# Patient Record
Sex: Female | Born: 1950 | Race: Black or African American | Hispanic: Refuse to answer | Smoking: Never smoker
Health system: Southern US, Community
[De-identification: ages and names within clinical notes are randomized; demographics above are authoritative.]

## PROBLEM LIST (undated history)

## (undated) DIAGNOSIS — M2550 Pain in unspecified joint: Secondary | ICD-10-CM

## (undated) DIAGNOSIS — R011 Cardiac murmur, unspecified: Secondary | ICD-10-CM

## (undated) DIAGNOSIS — I10 Essential (primary) hypertension: Secondary | ICD-10-CM

## (undated) DIAGNOSIS — K219 Gastro-esophageal reflux disease without esophagitis: Secondary | ICD-10-CM

## (undated) DIAGNOSIS — K649 Unspecified hemorrhoids: Secondary | ICD-10-CM

## (undated) DIAGNOSIS — Z9889 Other specified postprocedural states: Secondary | ICD-10-CM

## (undated) DIAGNOSIS — E785 Hyperlipidemia, unspecified: Secondary | ICD-10-CM

## (undated) DIAGNOSIS — T7840XA Allergy, unspecified, initial encounter: Secondary | ICD-10-CM

## (undated) DIAGNOSIS — M1990 Unspecified osteoarthritis, unspecified site: Secondary | ICD-10-CM

## (undated) DIAGNOSIS — R197 Diarrhea, unspecified: Secondary | ICD-10-CM

## (undated) DIAGNOSIS — R112 Nausea with vomiting, unspecified: Secondary | ICD-10-CM

## (undated) DIAGNOSIS — R51 Headache: Secondary | ICD-10-CM

## (undated) DIAGNOSIS — C801 Malignant (primary) neoplasm, unspecified: Secondary | ICD-10-CM

## (undated) HISTORY — DX: Cardiac murmur, unspecified: R01.1

## (undated) HISTORY — DX: Allergy, unspecified, initial encounter: T78.40XA

## (undated) HISTORY — PX: DILATION AND CURETTAGE OF UTERUS: SHX78

## (undated) HISTORY — DX: Hyperlipidemia, unspecified: E78.5

## (undated) HISTORY — PX: ABDOMINAL HYSTERECTOMY: SHX81

## (undated) HISTORY — DX: Essential (primary) hypertension: I10

## (undated) HISTORY — DX: Malignant (primary) neoplasm, unspecified: C80.1

## (undated) HISTORY — PX: APPENDECTOMY: SHX54

## (undated) HISTORY — PX: OTHER SURGICAL HISTORY: SHX169

## (undated) HISTORY — PX: DIAGNOSTIC LAPAROSCOPY: SUR761

## (undated) HISTORY — PX: CHOLECYSTECTOMY: SHX55

## (undated) HISTORY — DX: Unspecified hemorrhoids: K64.9

---

## 2006-02-20 HISTORY — PX: COLONOSCOPY: SHX174

## 2011-07-24 ENCOUNTER — Ambulatory Visit: Payer: Self-pay | Admitting: Family Medicine

## 2011-07-27 ENCOUNTER — Ambulatory Visit (INDEPENDENT_AMBULATORY_CARE_PROVIDER_SITE_OTHER): Payer: BC Managed Care – PPO | Admitting: Family Medicine

## 2011-07-27 ENCOUNTER — Encounter: Payer: Self-pay | Admitting: Family Medicine

## 2011-07-27 VITALS — BP 140/84 | HR 90 | Resp 18 | Ht 64.0 in | Wt 149.1 lb

## 2011-07-27 DIAGNOSIS — E785 Hyperlipidemia, unspecified: Secondary | ICD-10-CM

## 2011-07-27 DIAGNOSIS — I10 Essential (primary) hypertension: Secondary | ICD-10-CM

## 2011-07-27 DIAGNOSIS — Z1239 Encounter for other screening for malignant neoplasm of breast: Secondary | ICD-10-CM

## 2011-07-27 DIAGNOSIS — M129 Arthropathy, unspecified: Secondary | ICD-10-CM

## 2011-07-27 DIAGNOSIS — R12 Heartburn: Secondary | ICD-10-CM

## 2011-07-27 DIAGNOSIS — K648 Other hemorrhoids: Secondary | ICD-10-CM

## 2011-07-27 DIAGNOSIS — M1990 Unspecified osteoarthritis, unspecified site: Secondary | ICD-10-CM

## 2011-07-27 MED ORDER — HYDROCHLOROTHIAZIDE 25 MG PO TABS: 25.0000 mg | ORAL_TABLET | Freq: Every day | ORAL | Status: DC

## 2011-07-27 MED ORDER — HYDROCORTISONE ACETATE 25 MG RE SUPP: 25.0000 mg | Freq: Two times a day (BID) | RECTAL | Status: AC

## 2011-07-27 NOTE — Patient Instructions (Signed)
For your joints you can try Glucosamine over the counter  Fish oil is also good for inflammation and cholesterol  Continue blood pressure pill Pick up Calcium (1200mg )  and Vitamin D ( 800IU) I will get your records with your labs Try the suppository as needed Epsom salt/Sitz baths  F/U 3 months

## 2011-07-28 ENCOUNTER — Encounter: Payer: Self-pay | Admitting: Family Medicine

## 2011-07-28 DIAGNOSIS — M1990 Unspecified osteoarthritis, unspecified site: Secondary | ICD-10-CM | POA: Insufficient documentation

## 2011-07-28 DIAGNOSIS — I10 Essential (primary) hypertension: Secondary | ICD-10-CM | POA: Insufficient documentation

## 2011-07-28 DIAGNOSIS — R12 Heartburn: Secondary | ICD-10-CM | POA: Insufficient documentation

## 2011-07-28 DIAGNOSIS — K648 Other hemorrhoids: Secondary | ICD-10-CM | POA: Insufficient documentation

## 2011-07-28 NOTE — Assessment & Plan Note (Signed)
Continue tylenol as needed, also using Aspercreme.

## 2011-07-28 NOTE — Progress Notes (Signed)
  Subjective:    Patient ID: Yalitza Teed, female    DOB: Oct 31, 1950, 61 y.o.   MRN: [id number]  HPI Patient here to establish care. Previous PCP Dr. Wynona Neat at [facility] and [facility]. Surgeon Dr. [facility] Medications and history reviewed Hypertension has been on medications for the past year and a half. She is tolerating HCTZ. Arthritis has history of left leg pain mostly in the thigh region she's had imaging done per report told this is arthritis she uses Tylenol arthritis as needed. She is borderline hyperlipidemia. Colonoscopy done due for mammogram status post hysterectomy she does have a problem with internal hemorrhoids and she gets bright red blood in the stool. She was unable to afford Proctofoam. Has been using Tucks pads over-the-counter. 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  Review of Systems - per above   GEN- denies fatigue, fever, weight loss,weakness, recent illness HEENT- denies eye drainage, change in vision, nasal discharge, CVS- denies chest pain, palpitations RESP- denies SOB, cough, wheeze ABD- denies N/V, change in stools, abd pain GU- denies dysuria, hematuria, dribbling, incontinence MSK- + joint pain, muscle aches, injury Neuro- denies headache, dizziness, syncope, seizure activity      Objective:   Physical Exam GEN- NAD, alert and oriented x3 HEENT- PERRL, EOMI, non injected sclera, pink conjunctiva, MMM, oropharynx clear Neck- Supple, no thyromegaly CVS- RRR, soft SEM RESP-CTAB ABD-NABS,soft, NT,ND Hip- no pain with IR/ER, hip rock Back- neg SLR, spine NT EXT- No edema Pulses- Radial, DP- 2+ Psych-normal affect and Mood      Assessment & Plan:

## 2011-07-28 NOTE — Assessment & Plan Note (Signed)
Obtain her colonoscopy report, script given for anusol suppository, sitz baths as needed

## 2011-07-28 NOTE — Assessment & Plan Note (Signed)
Zantac as needed

## 2011-07-28 NOTE — Assessment & Plan Note (Signed)
Continue HCTZ, obtain records and recent labs

## 2011-07-28 NOTE — Assessment & Plan Note (Signed)
Given script to be done in [place]

## 2011-08-21 ENCOUNTER — Encounter: Payer: Self-pay | Admitting: Family Medicine

## 2011-09-14 ENCOUNTER — Encounter: Payer: Self-pay | Admitting: Family Medicine

## 2011-11-02 ENCOUNTER — Ambulatory Visit: Payer: BC Managed Care – PPO | Admitting: Family Medicine

## 2011-11-07 ENCOUNTER — Ambulatory Visit (HOSPITAL_COMMUNITY)
Admission: RE | Admit: 2011-11-07 | Discharge: 2011-11-07 | Disposition: A | Discharge status: Home / Self Care | Payer: BC Managed Care – PPO | Source: Ambulatory Visit | Attending: Family Medicine | Admitting: Family Medicine

## 2011-11-07 ENCOUNTER — Encounter: Payer: Self-pay | Admitting: Family Medicine

## 2011-11-07 ENCOUNTER — Ambulatory Visit (INDEPENDENT_AMBULATORY_CARE_PROVIDER_SITE_OTHER): Payer: BC Managed Care – PPO | Admitting: Family Medicine

## 2011-11-07 VITALS — BP 122/88 | HR 89 | Resp 15 | Ht 64.0 in | Wt 147.8 lb

## 2011-11-07 DIAGNOSIS — E785 Hyperlipidemia, unspecified: Secondary | ICD-10-CM

## 2011-11-07 DIAGNOSIS — M899 Disorder of bone, unspecified: Secondary | ICD-10-CM

## 2011-11-07 DIAGNOSIS — M79606 Pain in leg, unspecified: Secondary | ICD-10-CM

## 2011-11-07 DIAGNOSIS — M8580 Other specified disorders of bone density and structure, unspecified site: Secondary | ICD-10-CM

## 2011-11-07 DIAGNOSIS — M549 Dorsalgia, unspecified: Secondary | ICD-10-CM

## 2011-11-07 DIAGNOSIS — M545 Low back pain, unspecified: Secondary | ICD-10-CM | POA: Insufficient documentation

## 2011-11-07 DIAGNOSIS — M79609 Pain in unspecified limb: Secondary | ICD-10-CM

## 2011-11-07 DIAGNOSIS — M25559 Pain in unspecified hip: Secondary | ICD-10-CM | POA: Insufficient documentation

## 2011-11-07 DIAGNOSIS — I10 Essential (primary) hypertension: Secondary | ICD-10-CM

## 2011-11-07 LAB — CBC
HCT: 36.4 % (ref 36.0–46.0)
MCHC: 35.7 g/dL (ref 30.0–36.0)
MCV: 85.8 fL (ref 78.0–100.0)
RDW: 12.9 % (ref 11.5–15.5)

## 2011-11-07 NOTE — Patient Instructions (Signed)
Continue your current medications Get the x-rays done Blood work to be done  F/U 4 months

## 2011-11-07 NOTE — Progress Notes (Signed)
  Subjective:    Patient ID: Lindsay Mccormick, female    DOB: 04/06/1950, 61 y.o.   MRN: [id number]  HPI Pt still having pain in left leg which radiates to thigh and lower leg, no knee pain, admits to low back pain at time, tylenol helps. No change in bowel or bladder, no joint swelling She has osteopenia per Dexa Scan Due for fasting labs Medications reviewed   Review of Systems  GEN- denies fatigue, fever, weight loss,weakness, recent illness HEENT- denies eye drainage, change in vision, nasal discharge, CVS- denies chest pain, palpitations RESP- denies SOB, cough, wheeze ABD- denies N/V, change in stools, abd pain GU- denies dysuria, hematuria, dribbling, incontinence MSK- denies joint pain, muscle aches, injury Neuro- denies headache, dizziness, syncope, seizure activity      Objective:   Physical Exam GEN- NAD, alert and oriented x3 HEENT- PERRL, EOMI, non injected sclera, pink conjunctiva, MMM, oropharynx clear CVS- RRR, soft SEM RESP-CTAB ABD-NABS,soft, NT,ND Hip- no pain with IR/ER,  Back- neg SLR, spine NT EXT- No edema Pulses- Radial, DP- 2+        Assessment & Plan:

## 2011-11-08 ENCOUNTER — Encounter: Payer: Self-pay | Admitting: Family Medicine

## 2011-11-08 DIAGNOSIS — M810 Age-related osteoporosis without current pathological fracture: Secondary | ICD-10-CM | POA: Insufficient documentation

## 2011-11-08 DIAGNOSIS — M79606 Pain in leg, unspecified: Secondary | ICD-10-CM | POA: Insufficient documentation

## 2011-11-08 LAB — COMPREHENSIVE METABOLIC PANEL
ALT: 15 U/L (ref 0–35)
AST: 15 U/L (ref 0–37)
Creat: 0.66 mg/dL (ref 0.50–1.10)
Total Bilirubin: 0.4 mg/dL (ref 0.3–1.2)

## 2011-11-08 LAB — CK: Total CK: 50 U/L (ref 7–177)

## 2011-11-08 LAB — LIPID PANEL
HDL: 55 mg/dL (ref >39)
LDL Cholesterol: 134 mg/dL — ABNORMAL HIGH (ref 0–99)
Total CHOL/HDL Ratio: 4.2 Ratio
Triglycerides: 207 mg/dL — ABNORMAL HIGH (ref <150)
VLDL: 41 mg/dL — ABNORMAL HIGH (ref 0–40)

## 2011-11-08 NOTE — Assessment & Plan Note (Signed)
Start fish oil TG elevated

## 2011-11-08 NOTE — Assessment & Plan Note (Signed)
Well controlled, no change to meds 

## 2011-11-08 NOTE — Assessment & Plan Note (Addendum)
Unclear cause, chronic problem, xrays obtained which were normal, muscle enzymes normal, continue tylenol, no difficulties with gait, does not sound neuropathic

## 2011-11-08 NOTE — Assessment & Plan Note (Signed)
calcium Vit D

## 2012-03-08 ENCOUNTER — Ambulatory Visit: Payer: BC Managed Care – PPO | Admitting: Family Medicine

## 2012-06-03 ENCOUNTER — Ambulatory Visit (INDEPENDENT_AMBULATORY_CARE_PROVIDER_SITE_OTHER): Payer: BC Managed Care – PPO | Admitting: Family Medicine

## 2012-06-03 ENCOUNTER — Encounter: Payer: Self-pay | Admitting: Family Medicine

## 2012-06-03 VITALS — BP 142/80 | HR 76 | Resp 18 | Ht 64.0 in | Wt 147.0 lb

## 2012-06-03 DIAGNOSIS — J302 Other seasonal allergic rhinitis: Secondary | ICD-10-CM

## 2012-06-03 DIAGNOSIS — M899 Disorder of bone, unspecified: Secondary | ICD-10-CM

## 2012-06-03 DIAGNOSIS — M949 Disorder of cartilage, unspecified: Secondary | ICD-10-CM

## 2012-06-03 DIAGNOSIS — E785 Hyperlipidemia, unspecified: Secondary | ICD-10-CM

## 2012-06-03 DIAGNOSIS — I10 Essential (primary) hypertension: Secondary | ICD-10-CM

## 2012-06-03 DIAGNOSIS — J309 Allergic rhinitis, unspecified: Secondary | ICD-10-CM

## 2012-06-03 DIAGNOSIS — M8580 Other specified disorders of bone density and structure, unspecified site: Secondary | ICD-10-CM

## 2012-06-03 MED ORDER — LEVOCETIRIZINE DIHYDROCHLORIDE 5 MG PO TABS: 5.0000 mg | ORAL_TABLET | Freq: Every evening | ORAL | Status: DC

## 2012-06-03 MED ORDER — HYDROCHLOROTHIAZIDE 25 MG PO TABS: 25.0000 mg | ORAL_TABLET | Freq: Every day | ORAL | Status: DC

## 2012-06-03 NOTE — Assessment & Plan Note (Signed)
Trial of xyzal Failed claritin, benadryl, zyrtec, continue flonase

## 2012-06-03 NOTE — Patient Instructions (Addendum)
Try the Xyxal Calcium 1200mg  and Vit D 800IU  Continue current medications Get the labs done fasting F/U 4 months

## 2012-06-03 NOTE — Assessment & Plan Note (Signed)
Repeat FLP

## 2012-06-03 NOTE — Assessment & Plan Note (Signed)
Pt will obtain her last Dexa Scan and let me know Advised calcium and Vit D Weight bearing exercise

## 2012-06-03 NOTE — Assessment & Plan Note (Signed)
Well controlled, no change to meds 

## 2012-06-03 NOTE — Progress Notes (Signed)
  Subjective:    Patient ID: Lindsay Mccormick, female    DOB: 1950/09/16, 62 y.o.   MRN: [id number]  HPI Pt here to f/u chronic medical problems. Allergies causing her to sneeze, runny eyes and nose. Medicine not working as well. Due for repeat labs Taking MVI   Review of Systems - per above   GEN- denies fatigue, fever, weight loss,weakness, recent illness HEENT- denies eye drainage, change in vision, +nasal discharge, CVS- denies chest pain, palpitations RESP- denies SOB, cough, wheeze ABD- denies N/V, change in stools, abd pain GU- denies dysuria, hematuria, dribbling, incontinence MSK- denies joint pain, muscle aches, injury Neuro- denies headache, dizziness, syncope, seizure activity      Objective:   Physical Exam GEN- NAD, alert and oriented x3 HEENT- PERRL, EOMI, non injected sclera, pink conjunctiva, MMM, oropharynx clear Neck- Supple, no thryomegaly CVS- RRR, no murmur RESP-CTAB EXT- No edema Pulses- Radial, DP- 2+        Assessment & Plan:

## 2012-06-18 ENCOUNTER — Encounter: Payer: Self-pay | Admitting: *Deleted

## 2012-09-30 ENCOUNTER — Encounter (HOSPITAL_COMMUNITY)
Admission: RE | Admit: 2012-09-30 | Discharge: 2012-09-30 | Disposition: A | Discharge status: Home / Self Care | Payer: BC Managed Care – PPO | Source: Ambulatory Visit | Attending: Anesthesiology | Admitting: Anesthesiology

## 2012-09-30 ENCOUNTER — Encounter (HOSPITAL_COMMUNITY)
Admission: RE | Admit: 2012-09-30 | Discharge: 2012-09-30 | Disposition: A | Discharge status: Home / Self Care | Payer: BC Managed Care – PPO | Source: Ambulatory Visit | Attending: Oral and Maxillofacial Surgery | Admitting: Oral and Maxillofacial Surgery

## 2012-09-30 ENCOUNTER — Encounter (HOSPITAL_COMMUNITY): Payer: Self-pay

## 2012-09-30 DIAGNOSIS — Z01810 Encounter for preprocedural cardiovascular examination: Secondary | ICD-10-CM | POA: Insufficient documentation

## 2012-09-30 DIAGNOSIS — Z01818 Encounter for other preprocedural examination: Secondary | ICD-10-CM | POA: Insufficient documentation

## 2012-09-30 DIAGNOSIS — Z01812 Encounter for preprocedural laboratory examination: Secondary | ICD-10-CM | POA: Insufficient documentation

## 2012-09-30 HISTORY — DX: Diarrhea, unspecified: R19.7

## 2012-09-30 HISTORY — DX: Unspecified osteoarthritis, unspecified site: M19.90

## 2012-09-30 HISTORY — DX: Nausea with vomiting, unspecified: R11.2

## 2012-09-30 HISTORY — DX: Pain in unspecified joint: M25.50

## 2012-09-30 HISTORY — DX: Other specified postprocedural states: Z98.890

## 2012-09-30 HISTORY — DX: Gastro-esophageal reflux disease without esophagitis: K21.9

## 2012-09-30 HISTORY — DX: Headache: R51

## 2012-09-30 LAB — CBC
HCT: 36.3 % (ref 36.0–46.0)
MCH: 30.4 pg (ref 26.0–34.0)
MCV: 88.3 fL (ref 78.0–100.0)
Platelets: 272 10*3/uL (ref 150–400)
RDW: 12.5 % (ref 11.5–15.5)

## 2012-09-30 LAB — BASIC METABOLIC PANEL
BUN: 5 mg/dL — ABNORMAL LOW (ref 6–23)
CO2: 29 mEq/L (ref 19–32)
Calcium: 9.7 mg/dL (ref 8.4–10.5)
Chloride: 103 mEq/L (ref 96–112)
Creatinine, Ser: 0.65 mg/dL (ref 0.50–1.10)
Glucose, Bld: 104 mg/dL — ABNORMAL HIGH (ref 70–99)

## 2012-09-30 NOTE — Progress Notes (Signed)
Pt doesn't have a cardiologist  Denies ever having an echo/stress test/heart cath  Medical MD is [facility] in [place]  Denies EKG or CXR in past yr

## 2012-09-30 NOTE — Pre-Procedure Instructions (Signed)
Karl Knarr  09/30/2012   Your procedure is scheduled on:  Mon, Aug 18 @ 9:30 AM  Report to [facility] at 7:30 AM.  Call this number if you have problems the morning of surgery: [phone]   Remember:   Do not eat food or drink liquids after midnight.   Take these medicines the morning of surgery with A SIP OF WATER: Zantac(Ranitidine)               Stop taking your Ibuprofen.No Goody's,BC's,Aleve,Aspirin,Fish Oil,or any Herbal Medications   Do not wear jewelry, make-up or nail polish.  Do not wear lotions, powders, or perfumes. You may wear deodorant.  Do not shave 48 hours prior to surgery.   Do not bring valuables to the hospital.  [facility] is not responsible                   for any belongings or valuables.  Contacts, dentures or bridgework may not be worn into surgery.  Leave suitcase in the car. After surgery it may be brought to your room.     Patients discharged the day of surgery will not be allowed to drive  home.    Special Instructions: Shower using CHG 2 nights before surgery and the night before surgery.  If you shower the day of surgery use CHG.  Use special wash - you have one bottle of CHG for all showers.  You should use approximately 1/3 of the bottle for each shower.   Please read over the following fact sheets that you were given: Pain Booklet, Coughing and Deep Breathing and Surgical Site Infection Prevention

## 2012-10-02 ENCOUNTER — Ambulatory Visit: Payer: Self-pay | Admitting: Family Medicine

## 2012-10-02 ENCOUNTER — Encounter (HOSPITAL_COMMUNITY): Payer: Self-pay | Admitting: Oral and Maxillofacial Surgery

## 2012-10-02 DIAGNOSIS — M2740 Unspecified cyst of jaw: Secondary | ICD-10-CM | POA: Diagnosis present

## 2012-10-02 NOTE — H&P (Signed)
Lindsay Mccormick is an 62 y.o. female.   Chief Complaint: "Cyst in Jaw"  HPI: Lindsay Mccormick is a 62 year old female that was referred by her general dentist, Dr. Sophronia Simas, in [place], [place] for evaluation of a possible cyst in her right mandible associated with the impacted tooth #32.  We biopsied this large cyst and the final pathology report was an inflammed dentigerous cyst.  Due to the lesion being rather large (3.5 x 2.5 cm), the decision was made to excise the lesion and remove the impacted tooth in the OR.  PMHx:  Past Medical History  Diagnosis Date  . Hemorrhoids     Internal;uses Proctofoam bid  . Allergy     takes OTC med nightly and Benadryl nightly  . Cardiac murmur     Benign  . Hyperlipidemia     borderline but no meds at present  . GERD (gastroesophageal reflux disease)     takes Zantac daily  . Diarrhea     takes Imodium daily  . PONV (postoperative nausea and vomiting)   . Hypertension     takes HCTZ daily  . Headache(784.0)     rarely  . Arthritis   . Joint pain     PSx:  Past Surgical History  Procedure Laterality Date  . Abdominal hysterectomy    . Cholecystectomy    . Cesarean section      x 2  . Colonoscopy    . Appendectomy    . Diagnostic laparoscopy    . Dilation and curettage of uterus    . Jaw biopsy      Family Hx:  Family History  Problem Relation Age of Onset  . Heart disease Mother   . Cancer Mother     breast  . Diabetes Mother   . Hypertension Father   . Heart disease Father   . Cancer Father     prostate    Social History:  reports that she has never smoked. She does not have any smokeless tobacco history on file. She reports that she does not drink alcohol or use illicit drugs.  Allergies:  Allergies  Allergen Reactions  . Codeine     Sick and trembling    Meds:  No prescriptions prior to admission    Labs: No results found for this or any previous visit (from the past 48 hour(s)).  Radiology: No results found.  ROS:  A comprehensive review of systems was negative.  Vitals: There were no vitals taken for this visit.  Physical Exam: General appearance: alert and cooperative Head: Normocephalic, without obvious abnormality, atraumatic Eyes: conjunctivae/corneas clear. PERRL, EOM's intact. Fundi benign. Ears: normal TM's and external ear canals both ears Nose: Nares normal. Septum midline. Mucosa normal. No drainage or sinus tenderness. Throat: lips, mucosa, and tongue normal; teeth and gums normal Neck: no adenopathy, no carotid bruit, no JVD, supple, symmetrical, trachea midline and thyroid not enlarged, symmetric, no tenderness/mass/nodules Resp: clear to auscultation bilaterally Cardio: Murmur present GI: soft, non-tender; bowel sounds normal; no masses,  no organomegaly Extremities: extremities normal, atraumatic, no cyanosis or edema Pulses: 2+ and symmetric Skin: Skin color, texture, turgor normal. No rashes or lesions Lymph nodes: Cervical, supraclavicular, and axillary nodes normal. Neurologic: Alert and oriented X 3, normal strength and tone. Normal symmetric reflexes. Normal coordination and gait  Radiographic Panorex and CBCT shows 3.5 x 2.5 cm radiolucent lesion associated with complete bony impacted #32.    Assessment/Plan Lindsay Mccormick has a complete bony impacted #32 and  a biopsy proven inflammed dentigerous cyst in the right mandible.  1. The plan is to take the patient to the OR for Enucleation of Cyst and surgical extraction of the complete bony impacted #32 (which is positioned close to the inferior border and inferior alveolar nerve - which will be a difficult approach). 2. I explain to the patient that the mandible could possibly be fractured in removing this cyst and tooth, and we would perform ORIF of mandible with MMF if necessary.     [place],Kemal Amores L  10/02/2012, 3:47 PM

## 2012-10-03 ENCOUNTER — Encounter (HOSPITAL_COMMUNITY): Payer: Self-pay | Admitting: Pharmacy Technician

## 2012-10-03 ENCOUNTER — Ambulatory Visit: Payer: BC Managed Care – PPO | Admitting: Family Medicine

## 2012-10-03 NOTE — Progress Notes (Signed)
Requested orders from [facility];states Dr.Cram is out of the office today but will let him know

## 2012-10-03 NOTE — Progress Notes (Signed)
Requested orders, left message with Eunice Blase at Dr. Rhona Raider office.

## 2012-10-04 ENCOUNTER — Ambulatory Visit (HOSPITAL_BASED_OUTPATIENT_CLINIC_OR_DEPARTMENT_OTHER): Admit: 2012-10-04 | Payer: Self-pay | Admitting: Oral and Maxillofacial Surgery

## 2012-10-04 ENCOUNTER — Encounter (HOSPITAL_BASED_OUTPATIENT_CLINIC_OR_DEPARTMENT_OTHER): Payer: Self-pay

## 2012-10-04 SURGERY — DENTAL RESTORATION/EXTRACTIONS
Anesthesia: General | Site: Mouth

## 2012-10-07 ENCOUNTER — Encounter (HOSPITAL_COMMUNITY)
Admission: RE | Disposition: A | Discharge status: Home / Self Care | Payer: Self-pay | Source: Ambulatory Visit | Attending: Oral and Maxillofacial Surgery

## 2012-10-07 ENCOUNTER — Encounter (HOSPITAL_COMMUNITY): Payer: Self-pay | Admitting: Surgery

## 2012-10-07 ENCOUNTER — Encounter (HOSPITAL_COMMUNITY): Payer: Self-pay | Admitting: Certified Registered Nurse Anesthetist

## 2012-10-07 ENCOUNTER — Ambulatory Visit (HOSPITAL_COMMUNITY)
Admission: RE | Admit: 2012-10-07 | Discharge: 2012-10-07 | Disposition: A | Discharge status: Home / Self Care | Payer: BC Managed Care – PPO | Source: Ambulatory Visit | Attending: Oral and Maxillofacial Surgery | Admitting: Oral and Maxillofacial Surgery

## 2012-10-07 ENCOUNTER — Ambulatory Visit (HOSPITAL_COMMUNITY): Payer: BC Managed Care – PPO | Admitting: Certified Registered Nurse Anesthetist

## 2012-10-07 DIAGNOSIS — E785 Hyperlipidemia, unspecified: Secondary | ICD-10-CM | POA: Insufficient documentation

## 2012-10-07 DIAGNOSIS — K090 Developmental odontogenic cysts: Secondary | ICD-10-CM | POA: Insufficient documentation

## 2012-10-07 DIAGNOSIS — R011 Cardiac murmur, unspecified: Secondary | ICD-10-CM | POA: Insufficient documentation

## 2012-10-07 DIAGNOSIS — Z885 Allergy status to narcotic agent status: Secondary | ICD-10-CM | POA: Insufficient documentation

## 2012-10-07 DIAGNOSIS — K006 Disturbances in tooth eruption: Secondary | ICD-10-CM | POA: Insufficient documentation

## 2012-10-07 DIAGNOSIS — R197 Diarrhea, unspecified: Secondary | ICD-10-CM | POA: Insufficient documentation

## 2012-10-07 DIAGNOSIS — J309 Allergic rhinitis, unspecified: Secondary | ICD-10-CM | POA: Insufficient documentation

## 2012-10-07 DIAGNOSIS — M2740 Unspecified cyst of jaw: Secondary | ICD-10-CM

## 2012-10-07 DIAGNOSIS — K648 Other hemorrhoids: Secondary | ICD-10-CM | POA: Insufficient documentation

## 2012-10-07 DIAGNOSIS — M129 Arthropathy, unspecified: Secondary | ICD-10-CM | POA: Insufficient documentation

## 2012-10-07 DIAGNOSIS — K219 Gastro-esophageal reflux disease without esophagitis: Secondary | ICD-10-CM | POA: Insufficient documentation

## 2012-10-07 DIAGNOSIS — I10 Essential (primary) hypertension: Secondary | ICD-10-CM | POA: Insufficient documentation

## 2012-10-07 HISTORY — PX: TOOTH EXTRACTION: SHX859

## 2012-10-07 SURGERY — DENTAL RESTORATION/EXTRACTIONS
Anesthesia: General | Site: Mouth | Wound class: Clean Contaminated

## 2012-10-07 MED ORDER — LIDOCAINE-EPINEPHRINE 2 %-1:100000 IJ SOLN
INTRAMUSCULAR | Status: AC
  Filled 2012-10-07: qty 10.2

## 2012-10-07 MED ORDER — CLINDAMYCIN PHOSPHATE 600 MG/50ML IV SOLN
600.0000 mg | Freq: Once | INTRAVENOUS | Status: AC
  Administered 2012-10-07: 600 mg via INTRAVENOUS
  Filled 2012-10-07: qty 50

## 2012-10-07 MED ORDER — PROPOFOL 10 MG/ML IV BOLUS
INTRAVENOUS | Status: DC | PRN
  Administered 2012-10-07: 180 mg via INTRAVENOUS

## 2012-10-07 MED ORDER — ARTIFICIAL TEARS OP OINT
TOPICAL_OINTMENT | OPHTHALMIC | Status: DC | PRN
  Administered 2012-10-07: 1 via OPHTHALMIC

## 2012-10-07 MED ORDER — 0.9 % SODIUM CHLORIDE (POUR BTL) OPTIME
TOPICAL | Status: DC | PRN
  Administered 2012-10-07: 1000 mL

## 2012-10-07 MED ORDER — FENTANYL CITRATE 0.05 MG/ML IJ SOLN
INTRAMUSCULAR | Status: DC | PRN
  Administered 2012-10-07 (×2): 100 ug via INTRAVENOUS

## 2012-10-07 MED ORDER — LIDOCAINE HCL (CARDIAC) 20 MG/ML IV SOLN
INTRAVENOUS | Status: DC | PRN
  Administered 2012-10-07: 100 mg via INTRAVENOUS

## 2012-10-07 MED ORDER — OXYMETAZOLINE HCL 0.05 % NA SOLN
NASAL | Status: DC | PRN
  Administered 2012-10-07: 1 via NASAL

## 2012-10-07 MED ORDER — ROCURONIUM BROMIDE 100 MG/10ML IV SOLN
INTRAVENOUS | Status: DC | PRN
  Administered 2012-10-07: 50 mg via INTRAVENOUS

## 2012-10-07 MED ORDER — BUPIVACAINE-EPINEPHRINE (PF) 0.5% -1:200000 IJ SOLN
INTRAMUSCULAR | Status: AC
  Filled 2012-10-07: qty 3.6

## 2012-10-07 MED ORDER — MIDAZOLAM HCL 2 MG/2ML IJ SOLN: 1.0000 mg | INTRAMUSCULAR | Status: DC | PRN

## 2012-10-07 MED ORDER — METHYLENE BLUE 1 % INJ SOLN
INTRAMUSCULAR | Status: AC
  Filled 2012-10-07: qty 10

## 2012-10-07 MED ORDER — ONDANSETRON HCL 4 MG/2ML IJ SOLN
INTRAMUSCULAR | Status: DC | PRN
  Administered 2012-10-07: 4 mg via INTRAVENOUS

## 2012-10-07 MED ORDER — LIDOCAINE-EPINEPHRINE 1 %-1:100000 IJ SOLN
INTRAMUSCULAR | Status: AC
  Filled 2012-10-07: qty 1

## 2012-10-07 MED ORDER — NALOXONE HCL 0.4 MG/ML IJ SOLN
INTRAMUSCULAR | Status: DC | PRN
  Administered 2012-10-07: 60 ug via INTRAVENOUS

## 2012-10-07 MED ORDER — LACTATED RINGERS IV SOLN
INTRAVENOUS | Status: DC | PRN
  Administered 2012-10-07 (×2): via INTRAVENOUS

## 2012-10-07 MED ORDER — DEXAMETHASONE SODIUM PHOSPHATE 4 MG/ML IJ SOLN
INTRAMUSCULAR | Status: DC | PRN
  Administered 2012-10-07: 4 mg via INTRAVENOUS

## 2012-10-07 MED ORDER — MIDAZOLAM HCL 5 MG/5ML IJ SOLN
INTRAMUSCULAR | Status: DC | PRN
  Administered 2012-10-07: 2 mg via INTRAVENOUS

## 2012-10-07 MED ORDER — METOCLOPRAMIDE HCL 5 MG/ML IJ SOLN
INTRAMUSCULAR | Status: DC | PRN
  Administered 2012-10-07: 5 mg via INTRAVENOUS

## 2012-10-07 MED ORDER — HYDROMORPHONE HCL PF 1 MG/ML IJ SOLN: 0.2500 mg | INTRAMUSCULAR | Status: DC | PRN

## 2012-10-07 MED ORDER — FENTANYL CITRATE 0.05 MG/ML IJ SOLN: 50.0000 ug | Freq: Once | INTRAMUSCULAR | Status: DC

## 2012-10-07 MED ORDER — PROMETHAZINE HCL 25 MG/ML IJ SOLN: 6.2500 mg | INTRAMUSCULAR | Status: DC | PRN

## 2012-10-07 MED ORDER — GLYCOPYRROLATE 0.2 MG/ML IJ SOLN
INTRAMUSCULAR | Status: DC | PRN
  Administered 2012-10-07: 0.6 mg via INTRAVENOUS

## 2012-10-07 MED ORDER — OXYMETAZOLINE HCL 0.05 % NA SOLN
NASAL | Status: AC
  Filled 2012-10-07: qty 15

## 2012-10-07 MED ORDER — PHENYLEPHRINE HCL 10 MG/ML IJ SOLN
INTRAMUSCULAR | Status: DC | PRN
  Administered 2012-10-07 (×3): 40 ug via INTRAVENOUS

## 2012-10-07 MED ORDER — LIDOCAINE-EPINEPHRINE 1 %-1:100000 IJ SOLN
INTRAMUSCULAR | Status: DC | PRN
  Administered 2012-10-07 (×4): 1.7 mL

## 2012-10-07 MED ORDER — NEOSTIGMINE METHYLSULFATE 1 MG/ML IJ SOLN
INTRAMUSCULAR | Status: DC | PRN
  Administered 2012-10-07: 4 mg via INTRAVENOUS

## 2012-10-07 MED ORDER — BUPIVACAINE-EPINEPHRINE 0.5% -1:200000 IJ SOLN
INTRAMUSCULAR | Status: DC | PRN
  Administered 2012-10-07 (×2): 1.8 mL

## 2012-10-07 SURGICAL SUPPLY — 61 items
ATTRACTOMAT 16X20 MAGNETIC DRP (DRAPES) ×2 IMPLANT
BAND DENTAL 1/4IN PULL MED (MISCELLANEOUS) IMPLANT
BAND DENTAL 3/16IN PULL HEAVY (MISCELLANEOUS) IMPLANT
BLADE SURG 15 STRL LF DISP TIS (BLADE) ×2 IMPLANT
BLADE SURG 15 STRL SS (BLADE) ×2
BUR CROSS CUT (BURR)
BUR CROSS CUT FISSURE 1.6 (BURR) ×2 IMPLANT
BUR EGG ELITE 4.0 (BURR) ×2 IMPLANT
BUR RND FLUTED 2.5 (BURR) ×2 IMPLANT
BUR SRG MED 1.2XXCUT FSSR (BURR) IMPLANT
BUR SRG MED 1.6XXCUT FSSR (BURR) IMPLANT
BURR SRG MED 1.2XXCUT FSSR (BURR)
BURR SRG MED 1.6XXCUT FSSR (BURR)
CANISTER SUCTION 2500CC (MISCELLANEOUS) ×2 IMPLANT
CLEANER TIP ELECTROSURG 2X2 (MISCELLANEOUS) ×2 IMPLANT
CLOTH BEACON ORANGE TIMEOUT ST (SAFETY) ×2 IMPLANT
CONT SPEC 4OZ CLIKSEAL STRL BL (MISCELLANEOUS) ×2 IMPLANT
COVER SURGICAL LIGHT HANDLE (MISCELLANEOUS) ×2 IMPLANT
DRESSING TELFA 8X3 (GAUZE/BANDAGES/DRESSINGS) IMPLANT
ELECT COATED BLADE 2.86 ST (ELECTRODE) IMPLANT
ELECT NEEDLE BLADE 2-5/6 (NEEDLE) ×2 IMPLANT
ELECT REM PT RETURN 9FT ADLT (ELECTROSURGICAL) ×2
ELECTRODE REM PT RTRN 9FT ADLT (ELECTROSURGICAL) ×1 IMPLANT
GAUZE PACKING FOLDED 2  STR (GAUZE/BANDAGES/DRESSINGS) ×1
GAUZE PACKING FOLDED 2 STR (GAUZE/BANDAGES/DRESSINGS) ×1 IMPLANT
GAUZE SPONGE 4X4 16PLY XRAY LF (GAUZE/BANDAGES/DRESSINGS) ×2 IMPLANT
GLOVE BIOGEL M STRL SZ7.5 (GLOVE) ×2 IMPLANT
GLOVE BIOGEL PI IND STRL 6 (GLOVE) ×1 IMPLANT
GLOVE BIOGEL PI IND STRL 7.5 (GLOVE) ×3 IMPLANT
GLOVE BIOGEL PI INDICATOR 6 (GLOVE) ×1
GLOVE BIOGEL PI INDICATOR 7.5 (GLOVE) ×3
GLOVE ORTHO TXT STRL SZ7.5 (GLOVE) ×4 IMPLANT
GOWN STRL NON-REIN LRG LVL3 (GOWN DISPOSABLE) ×4 IMPLANT
KIT BASIN OR (CUSTOM PROCEDURE TRAY) ×2 IMPLANT
KIT ROOM TURNOVER OR (KITS) ×2 IMPLANT
NEEDLE BLUNT 16X1.5 OR ONLY (NEEDLE) ×4 IMPLANT
NEEDLE DENTAL 27 LONG (NEEDLE) ×4 IMPLANT
NS IRRIG 1000ML POUR BTL (IV SOLUTION) ×2 IMPLANT
PACK EENT II TURBAN DRAPE (CUSTOM PROCEDURE TRAY) ×2 IMPLANT
PAD ARMBOARD 7.5X6 YLW CONV (MISCELLANEOUS) ×4 IMPLANT
PENCIL BUTTON HOLSTER BLD 10FT (ELECTRODE) ×2 IMPLANT
SCISSORS WIRE ANG 4 3/4 DISP (INSTRUMENTS) IMPLANT
SOLUTION BETADINE 4OZ (MISCELLANEOUS) ×2 IMPLANT
SPONGE GAUZE 4X4 12PLY (GAUZE/BANDAGES/DRESSINGS) ×2 IMPLANT
SPONGE SURGIFOAM ABS GEL 100 (HEMOSTASIS) ×2 IMPLANT
SUT CHROMIC 3 0 PS 2 (SUTURE) ×2 IMPLANT
SUT STEEL 0 (SUTURE) ×1
SUT STEEL 0 18XMFL TIE 17 (SUTURE) ×1 IMPLANT
SUT STEEL 2 (SUTURE) ×2 IMPLANT
SUT VIC AB 4-0 P-3 18X BRD (SUTURE) ×1 IMPLANT
SUT VIC AB 4-0 P3 18 (SUTURE) ×1
SYR 50ML SLIP (SYRINGE) ×4 IMPLANT
SYR BULB IRRIGATION 50ML (SYRINGE) IMPLANT
TOOTHBRUSH ADULT (PERSONAL CARE ITEMS) ×2 IMPLANT
TOWEL OR 17X24 6PK STRL BLUE (TOWEL DISPOSABLE) ×2 IMPLANT
TOWEL OR 17X26 10 PK STRL BLUE (TOWEL DISPOSABLE) ×2 IMPLANT
TRAY ENT MC OR (CUSTOM PROCEDURE TRAY) ×2 IMPLANT
TUBE CONNECTING 12X1/4 (SUCTIONS) ×2 IMPLANT
TUBING IRRIGATION (MISCELLANEOUS) IMPLANT
WATER STERILE IRR 1000ML POUR (IV SOLUTION) ×2 IMPLANT
YANKAUER SUCT BULB TIP NO VENT (SUCTIONS) ×2 IMPLANT

## 2012-10-07 NOTE — Op Note (Signed)
10/07/2012  11:28 AM  PATIENT:  Lindsay Mccormick  62 y.o. female  PRE-OPERATIVE DIAGNOSIS:  INFLAMMED DENTIGEROUS CYST/IMPACTED #32  POST-OPERATIVE DIAGNOSIS:  INFLAMMED DENTIGEROUS CYST OF RIGHT MANDIBLE ASSOCIATED WITH #32 AND FULL BONY IMPACTED #32   INDICATIONS FOR PROCEDURE: The patient was referred from her general dentist in [place], [place] for evaluation of a cyst in the right mandible associated with #32. A panorex and a CBCT Scan was taken which showed that the lesion was was greater than 1.25 cm.   An incisional biopsy was performed and it was determined that the cyst was an inflammed dentigerous cyst.    PROCEDURE:   ENUCLEATION AND CURETTAGE OF INFLAMMED DENTIGEROUS CYST  FULL BONY IMPACTED #32  SURGEON:  Surgeon(s) and Role:    * Francene Finders, DDS - Primary  PHYSICIAN ASSISTANT: NONE  ASSISTANTS: NONE   ANESTHESIA:   general  PROCEDURE IN DETAIL: The patient was seen in the pre-operative area and the history and physical was updated.  The consent form was reviewed and verified.  The patient was taken to the OR and placed on the table in a supine position.  The patient was intubated nasally.   The patient was prepared and draped.  A throat pack was placed.  Next 4 carpules of % 2 Lidocaine with 1:100,000 epinephrine along with 0.5% Marcaine with 1:200,000 epinephrine was used to anesthetize the area of the right mandible (inferior alveolar and long buccal nerves).  Then a Bovie set at 30/30 was used to make an incision down to bone along the anterior ramus down to tooth #30.  A periosteal was used to reflect tissue.  Next, a round bur was used to remove bone to visualize #32.  Then, a fissure bur was used to section the tooth and elevators were used to remove the tooth. The tooth was difficult to access as it was positioned at the inferior border on top of the displaced Inferior Alveolar Nerve.  The bone in this area was also extremely thin; however, there was no fracture  on the bone with removal of the tooth.  Also, on the internal aspect of the cyst there was a large fenestration at the area of entrance of the inferior alveolar nerve into the bone. The fenestration was approximately 2 x 2 cm in diameter.  Next, curette was used to remove the cyst and an Alice clamp used to deliver the entire specimen.  Copious irrigation was used. The wound was closed with 4-0 vicryl.  The throat pack was removed and all counts were correct.    EBL:  Total I/O In: 1300 [I.V.:1300] Out: -   BLOOD ADMINISTERED:none  DRAINS: none   LOCAL MEDICATIONS USED:  0.5% MARCAINE with 1:200,000 - two carpules , 2% LIDOCAINE with 1:100,000 - four carpules  SPECIMEN:  Source of Specimen:  CYST FROM RIGHT MANDIBLE AND TOOTH #32  DISPOSITION OF SPECIMEN:  PATHOLOGY  COUNTS:  YES  TOURNIQUET:  * No tourniquets in log *  DICTATION: .Note written in EPIC  PLAN OF CARE: Discharge to home after PACU  PATIENT DISPOSITION:  PACU - hemodynamically stable.   Delay start of Pharmacological VTE agent (>24hrs) due to surgical blood loss or risk of bleeding: not applicable

## 2012-10-07 NOTE — Interval H&P Note (Signed)
History and Physical Interval Note:  10/07/2012 9:20 AM  Lindsay Mccormick  has presented today for surgery, with the diagnosis of INFLAMMED BENTIGEROUS CYST/IMPACTED #32  The various methods of treatment have been discussed with the patient and family. After consideration of risks, benefits and other options for treatment, the patient has consented to removal of inflamed dentigerous cyst from the right mandible and surgical extraction of #32 as well as the possible open reduction internal fixation of the mandible with maxillomandibular fixation as a surgical intervention .  The patient's history has been reviewed, patient examined, no change in status, stable for surgery.  I have reviewed the patient's chart and labs.  Questions were answered to the patient's satisfaction.     Dalton,Grete Bosko L

## 2012-10-07 NOTE — Transfer of Care (Signed)
Immediate Anesthesia Transfer of Care Note  Patient: Lindsay Mccormick  Procedure(s) Performed: Procedure(s): REMOVAL OF INFLAMMED DENTIGEROUS CYST/IMPACTED #32 (N/A)  Patient Location: PACU  Anesthesia Type:General  Level of Consciousness: awake, alert  and oriented  Airway & Oxygen Therapy: Patient Spontanous Breathing and Patient connected to nasal cannula oxygen  Post-op Assessment: Report given to PACU RN and Post -op Vital signs reviewed and stable  Post vital signs: Reviewed and stable  Complications: No apparent anesthesia complications

## 2012-10-07 NOTE — Preoperative (Signed)
Beta Blockers   Reason not to administer Beta Blockers:Not Applicable 

## 2012-10-07 NOTE — Anesthesia Procedure Notes (Signed)
Procedure Name: Intubation Date/Time: 10/07/2012 9:43 AM Performed by: Margaree Mackintosh Pre-anesthesia Checklist: Patient identified, Timeout performed, Emergency Drugs available, Suction available and Patient being monitored Patient Re-evaluated:Patient Re-evaluated prior to inductionOxygen Delivery Method: Circle system utilized Preoxygenation: Pre-oxygenation with 100% oxygen Intubation Type: IV induction Ventilation: Mask ventilation without difficulty Laryngoscope size: Glidescope. Grade View: Grade I Nasal Tubes: Nasal Rae and Left Tube size: 7.0 mm Number of attempts: 1 Airway Equipment and Method: Video-laryngoscopy Placement Confirmation: ETT inserted through vocal cords under direct vision,  positive ETCO2 and breath sounds checked- equal and bilateral Tube secured with: Tape Dental Injury: Teeth and Oropharynx as per pre-operative assessment  Comments: Red rubber catheter utilized to pass nasal tube to pharynx. Removed prior to DL.

## 2012-10-07 NOTE — Anesthesia Preprocedure Evaluation (Signed)
Anesthesia Evaluation  Patient identified by MRN, date of birth, ID band Patient awake    Reviewed: Allergy & Precautions, H&P , NPO status , Patient's Chart, lab work & pertinent test results  History of Anesthesia Complications (+) PONV  Airway Mallampati: II TM Distance: >3 FB Neck ROM: Full    Dental   Pulmonary  breath sounds clear to auscultation        Cardiovascular hypertension, Rhythm:Regular Rate:Normal     Neuro/Psych  Headaches,    GI/Hepatic GERD-  ,  Endo/Other    Renal/GU      Musculoskeletal   Abdominal   Peds  Hematology   Anesthesia Other Findings   Reproductive/Obstetrics                           Anesthesia Physical Anesthesia Plan  ASA: II  Anesthesia Plan: General   Post-op Pain Management:    Induction: Intravenous  Airway Management Planned: Nasal ETT and Video Laryngoscope Planned  Additional Equipment:   Intra-op Plan:   Post-operative Plan: Extubation in OR  Informed Consent: I have reviewed the patients History and Physical, chart, labs and discussed the procedure including the risks, benefits and alternatives for the proposed anesthesia with the patient or authorized representative who has indicated his/her understanding and acceptance.     Plan Discussed with: CRNA and Surgeon  Anesthesia Plan Comments:         Anesthesia Quick Evaluation

## 2012-10-07 NOTE — Brief Op Note (Signed)
10/07/2012  11:22 AM  PATIENT:  Lindsay Mccormick  62 y.o. female  PRE-OPERATIVE DIAGNOSIS:  INFLAMMED DENTIGEROUS CYST/IMPACTED #32  POST-OPERATIVE DIAGNOSIS:  INFLAMMED DENTIGEROUS CYST OF RIGHT MANDIBLE ASSOCIATED WITH #32 AND FULL BONY IMPACTED #32   PROCEDURE:   ENUCLEATION AND CURETTAGE OF INFLAMMED DENTIGEROUS CYST  FULL BONY IMPACTED #32  SURGEON:  Surgeon(s) and Role:    * Francene Finders, DDS - Primary  PHYSICIAN ASSISTANT: NONE  ASSISTANTS: NONE   ANESTHESIA:   general  EBL:  Total I/O In: 1300 [I.V.:1300] Out: -   BLOOD ADMINISTERED:none  DRAINS: none   LOCAL MEDICATIONS USED:  0.5% MARCAINE with 1:200,000 - two carpules , 2% LIDOCAINE with 1:100,000 - four carpules  SPECIMEN:  Source of Specimen:  CYST FROM RIGHT MANDIBLE AND TOOTH #32  DISPOSITION OF SPECIMEN:  PATHOLOGY  COUNTS:  YES  TOURNIQUET:  * No tourniquets in log *  DICTATION: .Note written in EPIC  PLAN OF CARE: Discharge to home after PACU  PATIENT DISPOSITION:  PACU - hemodynamically stable.   Delay start of Pharmacological VTE agent (>24hrs) due to surgical blood loss or risk of bleeding: not applicable

## 2012-10-08 ENCOUNTER — Encounter (HOSPITAL_COMMUNITY): Payer: Self-pay | Admitting: Oral and Maxillofacial Surgery

## 2012-10-08 NOTE — Anesthesia Postprocedure Evaluation (Signed)
  Anesthesia Post-op Note  Patient: Lindsay Mccormick  Procedure(s) Performed: Procedure(s): REMOVAL OF INFLAMMED DENTIGEROUS CYST/IMPACTED #32 (N/A)  Patient Location: PACU  Anesthesia Type:General  Level of Consciousness: awake and alert   Airway and Oxygen Therapy: Patient Spontanous Breathing  Post-op Pain: mild  Post-op Assessment: Post-op Vital signs reviewed  Post-op Vital Signs: stable  Complications: No apparent anesthesia complications

## 2012-10-15 ENCOUNTER — Ambulatory Visit: Payer: Self-pay | Admitting: Family Medicine

## 2012-10-22 ENCOUNTER — Ambulatory Visit: Payer: Self-pay | Admitting: Family Medicine

## 2012-10-23 ENCOUNTER — Ambulatory Visit: Payer: Self-pay | Admitting: Family Medicine

## 2013-03-11 ENCOUNTER — Ambulatory Visit: Payer: BC Managed Care – PPO | Admitting: Family Medicine

## 2013-03-18 ENCOUNTER — Encounter: Payer: Self-pay | Admitting: Family Medicine

## 2013-03-18 ENCOUNTER — Ambulatory Visit (INDEPENDENT_AMBULATORY_CARE_PROVIDER_SITE_OTHER): Payer: BC Managed Care – PPO | Admitting: Family Medicine

## 2013-03-18 VITALS — BP 148/98 | HR 94 | Temp 98.5°F | Resp 20 | Ht 61.0 in | Wt 145.0 lb

## 2013-03-18 DIAGNOSIS — K648 Other hemorrhoids: Secondary | ICD-10-CM

## 2013-03-18 DIAGNOSIS — M899 Disorder of bone, unspecified: Secondary | ICD-10-CM

## 2013-03-18 DIAGNOSIS — R198 Other specified symptoms and signs involving the digestive system and abdomen: Secondary | ICD-10-CM

## 2013-03-18 DIAGNOSIS — Z1211 Encounter for screening for malignant neoplasm of colon: Secondary | ICD-10-CM

## 2013-03-18 DIAGNOSIS — I10 Essential (primary) hypertension: Secondary | ICD-10-CM

## 2013-03-18 DIAGNOSIS — M8580 Other specified disorders of bone density and structure, unspecified site: Secondary | ICD-10-CM

## 2013-03-18 DIAGNOSIS — E785 Hyperlipidemia, unspecified: Secondary | ICD-10-CM

## 2013-03-18 DIAGNOSIS — R194 Change in bowel habit: Secondary | ICD-10-CM

## 2013-03-18 DIAGNOSIS — M949 Disorder of cartilage, unspecified: Secondary | ICD-10-CM

## 2013-03-18 LAB — COMPREHENSIVE METABOLIC PANEL
ALBUMIN: 4.7 g/dL (ref 3.5–5.2)
ALK PHOS: 62 U/L (ref 39–117)
ALT: 23 U/L (ref 0–35)
AST: 22 U/L (ref 0–37)
BUN: 7 mg/dL (ref 6–23)
CO2: 31 mEq/L (ref 19–32)
Calcium: 10.2 mg/dL (ref 8.4–10.5)
Chloride: 100 mEq/L (ref 96–112)
Creat: 0.66 mg/dL (ref 0.50–1.10)
Glucose, Bld: 102 mg/dL — ABNORMAL HIGH (ref 70–99)
POTASSIUM: 3.9 meq/L (ref 3.5–5.3)
Sodium: 142 mEq/L (ref 135–145)
Total Bilirubin: 0.5 mg/dL (ref 0.3–1.2)
Total Protein: 7.4 g/dL (ref 6.0–8.3)

## 2013-03-18 LAB — CBC WITH DIFFERENTIAL/PLATELET
BASOS PCT: 0 % (ref 0–1)
Basophils Absolute: 0 10*3/uL (ref 0.0–0.1)
EOS ABS: 0.1 10*3/uL (ref 0.0–0.7)
Eosinophils Relative: 2 % (ref 0–5)
HEMATOCRIT: 37.1 % (ref 36.0–46.0)
HEMOGLOBIN: 12.8 g/dL (ref 12.0–15.0)
Lymphocytes Relative: 49 % — ABNORMAL HIGH (ref 12–46)
Lymphs Abs: 2.9 10*3/uL (ref 0.7–4.0)
MCH: 29.7 pg (ref 26.0–34.0)
MCHC: 34.5 g/dL (ref 30.0–36.0)
MCV: 86.1 fL (ref 78.0–100.0)
MONO ABS: 0.5 10*3/uL (ref 0.1–1.0)
MONOS PCT: 8 % (ref 3–12)
Neutro Abs: 2.4 10*3/uL (ref 1.7–7.7)
Neutrophils Relative %: 41 % — ABNORMAL LOW (ref 43–77)
Platelets: 267 10*3/uL (ref 150–400)
RBC: 4.31 MIL/uL (ref 3.87–5.11)
RDW: 13.8 % (ref 11.5–15.5)
WBC: 5.9 10*3/uL (ref 4.0–10.5)

## 2013-03-18 LAB — LIPID PANEL
CHOL/HDL RATIO: 3.8 ratio
CHOLESTEROL: 259 mg/dL — AB (ref 0–200)
HDL: 68 mg/dL (ref >39)
LDL CALC: 161 mg/dL — AB (ref 0–99)
Triglycerides: 151 mg/dL — ABNORMAL HIGH (ref <150)
VLDL: 30 mg/dL (ref 0–40)

## 2013-03-18 LAB — TSH: TSH: 1.317 u[IU]/mL (ref 0.350–4.500)

## 2013-03-18 MED ORDER — HYDROCHLOROTHIAZIDE 25 MG PO TABS: 25.0000 mg | ORAL_TABLET | Freq: Every day | ORAL | Status: DC

## 2013-03-18 NOTE — Progress Notes (Signed)
   Subjective:    Patient ID: Lindsay Mccormick, female    DOB: Jul 06, 1950, 63 y.o.   MRN: [id number]  HPI  Patient here to follow chronic medical problems. His history of hypertension she's been taking her blood pressure medicine as prescribed she did not take this morning due to fasting labs. She's been checking her pressure at home and her systolic stays less than 735 She was recently evaluated for cystoscopy permeable which she had removed by oral surgery she is doing fairly well from this  She has noticed a change in her bowels recently where she has loose stools after most meals. She is due for colonoscopy this year would like to have this evaluated she has multiple bowel movements a day. She has had some blood in the stools but she does have a history of hemorrhoids does well  Osteopenia she is overdue for bone density/mammogram overdue   Review of Systems   GEN- denies fatigue, fever, weight loss,weakness, recent illness HEENT- denies eye drainage, change in vision, nasal discharge, CVS- denies chest pain, palpitations RESP- denies SOB, cough, wheeze ABD- denies N/V, +change in stools, abd pain GU- denies dysuria, hematuria, dribbling, incontinence MSK- denies joint pain, muscle aches, injury Neuro- denies headache, dizziness, syncope, seizure activity      Objective:   Physical Exam GEN- NAD, alert and oriented x3 HEENT- PERRL, EOMI, non injected sclera, pink conjunctiva, MMM, oropharynx clear Neck- Supple, no bruit CVS- RRR, no murmur RESP-CTAB ABD-NABS,soft,NT,ND EXT- No edema Pulses- Radial, DP- 2+        Assessment & Plan:

## 2013-03-18 NOTE — Assessment & Plan Note (Signed)
F/U with GI

## 2013-03-18 NOTE — Addendum Note (Signed)
Addended by: Burkburnett, John Williamsen F on: 03/18/2013 09:45 PM   Modules accepted: Orders  

## 2013-03-18 NOTE — Assessment & Plan Note (Signed)
DEXA scan ordered, Vit D level

## 2013-03-18 NOTE — Patient Instructions (Signed)
Schedule Bone Density/ and Mammogram  We will call if labs are abnormal otherwise letter will be sent Referral to Dr. Audie Box  Try the probiotics for your stomach Shingles vaccine F/U 6 Months- Physical

## 2013-03-18 NOTE — Assessment & Plan Note (Signed)
Recheck lipid panel, no statin drug currently

## 2013-03-18 NOTE — Assessment & Plan Note (Signed)
She would like to return to GI for evaluation and Scope, will refer

## 2013-03-18 NOTE — Assessment & Plan Note (Signed)
BP elevated today, no meds on board, continue HCTZ

## 2013-03-19 LAB — VITAMIN D 25 HYDROXY (VIT D DEFICIENCY, FRACTURES): Vit D, 25-Hydroxy: 18 ng/mL — ABNORMAL LOW (ref 30–89)

## 2013-03-19 MED ORDER — VITAMIN D (ERGOCALCIFEROL) 1.25 MG (50000 UNIT) PO CAPS: 50000.0000 [IU] | ORAL_CAPSULE | ORAL | Status: DC

## 2013-03-19 MED ORDER — SIMVASTATIN 10 MG PO TABS: 10.0000 mg | ORAL_TABLET | Freq: Every day | ORAL | Status: DC

## 2013-03-19 NOTE — Addendum Note (Signed)
Addended by: Vic Blackbird F on: 03/19/2013 01:53 PM   Modules accepted: Orders

## 2013-03-21 ENCOUNTER — Other Ambulatory Visit: Payer: Self-pay | Admitting: Family Medicine

## 2013-03-24 ENCOUNTER — Encounter: Payer: Self-pay | Admitting: Gastroenterology

## 2013-04-09 ENCOUNTER — Telehealth: Payer: Self-pay | Admitting: Gastroenterology

## 2013-04-09 ENCOUNTER — Ambulatory Visit: Payer: BC Managed Care – PPO | Admitting: Gastroenterology

## 2013-04-09 NOTE — Telephone Encounter (Signed)
Please send letter.

## 2013-04-09 NOTE — Telephone Encounter (Signed)
Pt was a no show

## 2013-04-15 ENCOUNTER — Encounter: Payer: Self-pay | Admitting: Gastroenterology

## 2013-04-15 NOTE — Telephone Encounter (Signed)
MAILED LETTER °

## 2013-05-13 ENCOUNTER — Encounter: Payer: Self-pay | Admitting: Family Medicine

## 2013-05-29 ENCOUNTER — Ambulatory Visit (INDEPENDENT_AMBULATORY_CARE_PROVIDER_SITE_OTHER): Payer: BC Managed Care – PPO | Admitting: Gastroenterology

## 2013-05-29 ENCOUNTER — Encounter: Payer: Self-pay | Admitting: Gastroenterology

## 2013-05-29 ENCOUNTER — Other Ambulatory Visit: Payer: Self-pay | Admitting: Internal Medicine

## 2013-05-29 VITALS — BP 136/88 | HR 95 | Temp 97.9°F | Ht 63.0 in | Wt 145.4 lb

## 2013-05-29 DIAGNOSIS — R194 Change in bowel habit: Secondary | ICD-10-CM

## 2013-05-29 DIAGNOSIS — R195 Other fecal abnormalities: Secondary | ICD-10-CM

## 2013-05-29 DIAGNOSIS — R198 Other specified symptoms and signs involving the digestive system and abdomen: Secondary | ICD-10-CM

## 2013-05-29 DIAGNOSIS — R110 Nausea: Secondary | ICD-10-CM

## 2013-05-29 DIAGNOSIS — Z1211 Encounter for screening for malignant neoplasm of colon: Secondary | ICD-10-CM

## 2013-05-29 MED ORDER — PEG 3350-KCL-NA BICARB-NACL 420 G PO SOLR: 4000.0000 mL | ORAL | Status: DC

## 2013-05-29 MED ORDER — DICYCLOMINE HCL 10 MG PO CAPS: 10.0000 mg | ORAL_CAPSULE | Freq: Three times a day (TID) | ORAL | Status: DC

## 2013-05-29 MED ORDER — ESOMEPRAZOLE MAGNESIUM 40 MG PO CPDR: 40.0000 mg | DELAYED_RELEASE_CAPSULE | Freq: Every day | ORAL | Status: DC

## 2013-05-29 NOTE — Assessment & Plan Note (Signed)
Vague reports of nausea and early satiety without aggravating or relieving factors. Hx of chronic GERD but without typical GERD symptoms. Zantac BID currently. Does admit to taking Ibuprofen prn. May have gastritis, doubt significant ulcer disease or other etiology. Start Nexium each morning; I have discussed proceeding with an EGD at time of TCS if no improvement with these measures. Risks and benefits discussed and understood.

## 2013-05-29 NOTE — Progress Notes (Signed)
cc'd to pcp 

## 2013-05-29 NOTE — Progress Notes (Signed)
Unable to retrieve any op notes from prior colonoscopy.

## 2013-05-29 NOTE — Assessment & Plan Note (Signed)
63 year old quite pleasant female with chronic loose stool since cholecystectomy at least 10 years ago, usually baseline 1 per day, now mild with increased frequency and postprandial component. No profuse diarrhea. Low-volume hematochezia noted in past several months, likely benign source. Question underlying IBS, bile salt diarrhea, doubt occult malignancy. No other concerning factors noted. Last colonoscopy in 2008 at an outside facility, with records requested. Due to low-volume hematochezia, will need early interval colonoscopy.   Proceed with TCS with Dr. Gala Romney in near future: the risks, benefits, and alternatives have been discussed with the patient in detail. The patient states understanding and desires to proceed. Bentyl TID prn  Hold on stool studies unless significant changes between now and colonoscopy

## 2013-05-29 NOTE — Progress Notes (Signed)
Referring Provider: Alycia Rossetti, MD Primary Care Physician:  Vic Blackbird, MD Primary Gastroenterologist:  Dr. Gala Romney   Chief Complaint  Patient presents with  . Nausea  . Hematochezia    HPI:   Lindsay Mccormick presents today at the request of Dr. Buelah Manis for updated colonoscopy. Last lower GI evaluation in [place] in 2008.Operative notes have been requested. Notes change in bowel habits: now with more frequent loose stools. Postprandial component. Present since cholecystectomy at least 10 years ago. Usually once or twice a day. Denies profuse diarrhea. States baseline used to be 1 stool daily. Sometimes fine. Takes anti-diarrheal if on trips. Intermittent low-volume hematochezia, last noted in Jan/Feb 2015. Notes vague nausea, intermittently. Started about a month ago. Notes early satiety. No unexplained weight loss. Comes on suddenly. Not aggravated by eating. Denies typical reflux symptoms. Zantac BID. Occasional Ibuprofen for tooth pain; hx of abscess. No melena.   Past Medical History  Diagnosis Date  . Hemorrhoids     Internal;uses Proctofoam bid  . Allergy     takes OTC med nightly and Benadryl nightly  . Cardiac murmur     Benign  . Hyperlipidemia     borderline but no meds at present  . GERD (gastroesophageal reflux disease)     takes Zantac daily  . Diarrhea     takes Imodium daily  . PONV (postoperative nausea and vomiting)   . Hypertension     takes HCTZ daily  . Headache(784.0)     rarely  . Arthritis   . Joint pain     Past Surgical History  Procedure Laterality Date  . Abdominal hysterectomy    . Cholecystectomy      at least 10 years ago   . Cesarean section      x 2  . Colonoscopy  2008    Dr. Audie Box in [place]  . Appendectomy    . Diagnostic laparoscopy    . Dilation and curettage of uterus    . Jaw biopsy    . Tooth extraction N/A 10/07/2012    Procedure: REMOVAL OF INFLAMMED DENTIGEROUS CYST/IMPACTED #32;  Surgeon:  Isac Caddy, DDS;  Location: [place] [facility];  Service: Oral Surgery;  Laterality: N/A;    Current Outpatient Prescriptions  Medication Sig Dispense Refill  . acetaminophen (TYLENOL) 500 MG tablet Take 500 mg by mouth every 6 (six) hours as needed for pain.      . diphenhydrAMINE (BENADRYL) 25 MG tablet Take 25 mg by mouth at bedtime.      . hydrochlorothiazide (HYDRODIURIL) 25 MG tablet Take 1 tablet (25 mg total) by mouth daily.  30 tablet  6  . ibuprofen (ADVIL,MOTRIN) 200 MG tablet Take 400 mg by mouth daily as needed for pain.       Marland Kitchen loperamide (IMODIUM) 2 MG capsule Take 2 mg by mouth 4 (four) times daily as needed for diarrhea or loose stools.      . ranitidine (ZANTAC) 150 MG tablet Take 150 mg by mouth 2 (two) times daily as needed for heartburn.      . simvastatin (ZOCOR) 10 MG tablet Take 1 tablet (10 mg total) by mouth at bedtime.  30 tablet  3  . Vitamin D, Ergocalciferol, (DRISDOL) 50000 UNITS CAPS capsule Take 1 capsule (50,000 Units total) by mouth every 7 (seven) days.  4 capsule  2   No current facility-administered medications for this visit.    Allergies as of 05/29/2013 - Review Complete 05/29/2013  Allergen Reaction Noted  . Codeine Other (See Comments) 09/30/2012    Family History  Problem Relation Age of Onset  . Heart disease Mother   . Cancer Mother     breast  . Diabetes Mother   . Hypertension Father   . Heart disease Father   . Cancer Father     prostate  . Colon cancer Neg Hx     however husband passed away from colon cancer 10 years ago    History   Social History  . Marital Status: Widowed    Spouse Name: N/A    Number of Children: N/A  . Years of Education: N/A   Occupational History  . OFFICE MANAGER      [employer]    Social History Main Topics  . Smoking status: Never Smoker   . Smokeless tobacco: Not on file  . Alcohol Use: No  . Drug Use: No  . Sexual Activity: No     Comment: Husband deceased   Other Topics Concern  . Not on  file   Social History Narrative  . No narrative on file    Review of Systems: As mentioned in HPI  Physical Exam: BP 136/88  Pulse 95  Temp(Src) 97.9 F (36.6 C) (Oral)  Ht 5\' 3"  (1.6 m)  Wt 145 lb 6.4 oz (65.953 kg)  BMI 25.76 kg/m2 General:   Alert and oriented. Well-developed, well-nourished, pleasant and cooperative. Head:  Normocephalic and atraumatic. Eyes:  Conjunctiva pink, sclera clear, no icterus.   Conjunctiva pink. Ears:  Normal auditory acuity. Nose:  No deformity, discharge,  or lesions. Mouth:  No deformity or lesions, mucosa pink and moist.  Neck:  Supple, without mass or thyromegaly. Lungs:  Clear to auscultation bilaterally, without wheezing, rales, or rhonchi.  Heart:  S1, S2 present without murmurs noted.  Abdomen:  +BS, soft, non-tender and non-distended. Without mass or HSM. No rebound or guarding. No hernias noted. Rectal:  Deferred  Msk:  Symmetrical without gross deformities. Normal posture. Extremities:  Without clubbing or edema. Neurologic:  Alert and  oriented x4;  grossly normal neurologically. Skin:  Intact, warm and dry without significant lesions or rashes Cervical Nodes:  No significant cervical adenopathy. Psych:  Alert and cooperative. Normal mood and affect.

## 2013-05-29 NOTE — Patient Instructions (Addendum)
I have sent Bentyl to your pharmacy to take up to 3 times a day with meals to help with urgency after eating.   I have provided Nexium samples to take each morning, 30 minutes before breakfast. This is in place of Zantac. If this helps with the nausea, we will have you continue it.  We have scheduled you for a colonoscopy and possible upper endoscopy (if nausea is not better) with Dr. Gala Romney.  Further recommendations to follow!

## 2013-06-06 ENCOUNTER — Encounter (HOSPITAL_COMMUNITY): Payer: Self-pay | Admitting: Pharmacy Technician

## 2013-06-09 ENCOUNTER — Encounter: Payer: Self-pay | Admitting: Family Medicine

## 2013-06-10 NOTE — Telephone Encounter (Signed)
Appointment rescheduled per patient request. °

## 2013-06-17 ENCOUNTER — Ambulatory Visit: Payer: Self-pay | Admitting: Family Medicine

## 2013-06-21 ENCOUNTER — Telehealth (INDEPENDENT_AMBULATORY_CARE_PROVIDER_SITE_OTHER): Payer: Self-pay | Admitting: Internal Medicine

## 2013-06-21 NOTE — Telephone Encounter (Signed)
Patient called had question about prep. Patient advised to use one fleets enema on Monday morning if she is not passing clear stools. He would then clear liquids starting 2 PM tomorrow and take PEG solution.

## 2013-06-23 ENCOUNTER — Ambulatory Visit (HOSPITAL_COMMUNITY)
Admission: RE | Admit: 2013-06-23 | Discharge: 2013-06-23 | Disposition: A | Discharge status: Home / Self Care | Payer: BC Managed Care – PPO | Source: Ambulatory Visit | Attending: Internal Medicine | Admitting: Internal Medicine

## 2013-06-23 ENCOUNTER — Encounter (HOSPITAL_COMMUNITY)
Admission: RE | Disposition: A | Discharge status: Home / Self Care | Payer: Self-pay | Source: Ambulatory Visit | Attending: Internal Medicine

## 2013-06-23 ENCOUNTER — Encounter (HOSPITAL_COMMUNITY): Payer: Self-pay | Admitting: *Deleted

## 2013-06-23 DIAGNOSIS — K649 Unspecified hemorrhoids: Secondary | ICD-10-CM

## 2013-06-23 DIAGNOSIS — I10 Essential (primary) hypertension: Secondary | ICD-10-CM | POA: Insufficient documentation

## 2013-06-23 DIAGNOSIS — Z79899 Other long term (current) drug therapy: Secondary | ICD-10-CM | POA: Insufficient documentation

## 2013-06-23 DIAGNOSIS — K648 Other hemorrhoids: Secondary | ICD-10-CM | POA: Insufficient documentation

## 2013-06-23 DIAGNOSIS — Z885 Allergy status to narcotic agent status: Secondary | ICD-10-CM | POA: Insufficient documentation

## 2013-06-23 DIAGNOSIS — M129 Arthropathy, unspecified: Secondary | ICD-10-CM | POA: Insufficient documentation

## 2013-06-23 DIAGNOSIS — Z9884 Bariatric surgery status: Secondary | ICD-10-CM | POA: Insufficient documentation

## 2013-06-23 DIAGNOSIS — K2970 Gastritis, unspecified, without bleeding: Secondary | ICD-10-CM

## 2013-06-23 DIAGNOSIS — Z1211 Encounter for screening for malignant neoplasm of colon: Secondary | ICD-10-CM

## 2013-06-23 DIAGNOSIS — K2100 Gastro-esophageal reflux disease with esophagitis, without bleeding: Secondary | ICD-10-CM

## 2013-06-23 DIAGNOSIS — K5730 Diverticulosis of large intestine without perforation or abscess without bleeding: Secondary | ICD-10-CM | POA: Insufficient documentation

## 2013-06-23 DIAGNOSIS — R195 Other fecal abnormalities: Secondary | ICD-10-CM

## 2013-06-23 DIAGNOSIS — R197 Diarrhea, unspecified: Secondary | ICD-10-CM

## 2013-06-23 DIAGNOSIS — R110 Nausea: Secondary | ICD-10-CM

## 2013-06-23 DIAGNOSIS — K2990 Gastroduodenitis, unspecified, without bleeding: Secondary | ICD-10-CM

## 2013-06-23 DIAGNOSIS — K529 Noninfective gastroenteritis and colitis, unspecified: Secondary | ICD-10-CM

## 2013-06-23 HISTORY — PX: COLONOSCOPY: SHX5424

## 2013-06-23 HISTORY — PX: ESOPHAGOGASTRODUODENOSCOPY: SHX5428

## 2013-06-23 SURGERY — COLONOSCOPY
Anesthesia: Moderate Sedation

## 2013-06-23 MED ORDER — LIDOCAINE VISCOUS 2 % MT SOLN
OROMUCOSAL | Status: AC
  Filled 2013-06-23: qty 15

## 2013-06-23 MED ORDER — LIDOCAINE VISCOUS 2 % MT SOLN
OROMUCOSAL | Status: DC | PRN
  Administered 2013-06-23: 3 mL via OROMUCOSAL

## 2013-06-23 MED ORDER — SODIUM CHLORIDE 0.9 % IV SOLN
INTRAVENOUS | Status: DC
  Administered 2013-06-23: 1000 mL via INTRAVENOUS

## 2013-06-23 MED ORDER — MEPERIDINE HCL 100 MG/ML IJ SOLN
INTRAMUSCULAR | Status: DC | PRN
  Administered 2013-06-23: 25 mg via INTRAVENOUS
  Administered 2013-06-23: 50 mg via INTRAVENOUS

## 2013-06-23 MED ORDER — MIDAZOLAM HCL 5 MG/5ML IJ SOLN
INTRAMUSCULAR | Status: DC | PRN
  Administered 2013-06-23: 2 mg via INTRAVENOUS
  Administered 2013-06-23 (×3): 1 mg via INTRAVENOUS

## 2013-06-23 MED ORDER — MIDAZOLAM HCL 5 MG/5ML IJ SOLN
INTRAMUSCULAR | Status: AC
  Filled 2013-06-23: qty 10

## 2013-06-23 MED ORDER — ONDANSETRON HCL 4 MG/2ML IJ SOLN
INTRAMUSCULAR | Status: DC | PRN
  Administered 2013-06-23: 4 mg via INTRAVENOUS

## 2013-06-23 MED ORDER — MEPERIDINE HCL 100 MG/ML IJ SOLN
INTRAMUSCULAR | Status: AC
  Filled 2013-06-23: qty 2

## 2013-06-23 MED ORDER — STERILE WATER FOR IRRIGATION IR SOLN
Status: DC | PRN
  Administered 2013-06-23: 09:00:00

## 2013-06-23 MED ORDER — ONDANSETRON HCL 4 MG/2ML IJ SOLN
INTRAMUSCULAR | Status: AC
  Filled 2013-06-23: qty 2

## 2013-06-23 NOTE — Interval H&P Note (Signed)
Some cramping with Nexium. No real improvement in nausea. Therefore, per plan, we'll perform both EGD and colonoscopy today.  The risks, benefits, limitations, imponderables and alternatives regarding both EGD and colonoscopy have been reviewed with the patient. Questions have been answered. All parties agreeable.  History and Physical Interval Note:  06/23/2013 9:23 AM  Lindsay Mccormick  has presented today for surgery, with the diagnosis of SCREENING COLONOSCOPY , LOOSE STOOLS AND NAUSEA  The various methods of treatment have been discussed with the patient and family. After consideration of risks, benefits and other options for treatment, the patient has consented to  Procedure(s) with comments: COLONOSCOPY (N/A) - 8:30 ESOPHAGOGASTRODUODENOSCOPY (EGD) (N/A) as a surgical intervention .  The patient's history has been reviewed, patient examined, no change in status, stable for surgery.  I have reviewed the patient's chart and labs.  Questions were answered to the patient's satisfaction.         Cristopher Estimable Rourk

## 2013-06-23 NOTE — H&P (View-Only) (Signed)
Referring Provider: Alycia Rossetti, MD Primary Care Physician:  Vic Blackbird, MD Primary Gastroenterologist:  Dr. Gala Romney   Chief Complaint  Patient presents with  . Nausea  . Hematochezia    HPI:   Lindsay Mccormick presents today at the request of Dr. Buelah Manis for updated colonoscopy. Last lower GI evaluation in [place] in 2008.Operative notes have been requested. Notes change in bowel habits: now with more frequent loose stools. Postprandial component. Present since cholecystectomy at least 10 years ago. Usually once or twice a day. Denies profuse diarrhea. States baseline used to be 1 stool daily. Sometimes fine. Takes anti-diarrheal if on trips. Intermittent low-volume hematochezia, last noted in Jan/Feb 2015. Notes vague nausea, intermittently. Started about a month ago. Notes early satiety. No unexplained weight loss. Comes on suddenly. Not aggravated by eating. Denies typical reflux symptoms. Zantac BID. Occasional Ibuprofen for tooth pain; hx of abscess. No melena.   Past Medical History  Diagnosis Date  . Hemorrhoids     Internal;uses Proctofoam bid  . Allergy     takes OTC med nightly and Benadryl nightly  . Cardiac murmur     Benign  . Hyperlipidemia     borderline but no meds at present  . GERD (gastroesophageal reflux disease)     takes Zantac daily  . Diarrhea     takes Imodium daily  . PONV (postoperative nausea and vomiting)   . Hypertension     takes HCTZ daily  . Headache(784.0)     rarely  . Arthritis   . Joint pain     Past Surgical History  Procedure Laterality Date  . Abdominal hysterectomy    . Cholecystectomy      at least 10 years ago   . Cesarean section      x 2  . Colonoscopy  2008    Dr. Audie Box in [place]  . Appendectomy    . Diagnostic laparoscopy    . Dilation and curettage of uterus    . Jaw biopsy    . Tooth extraction N/A 10/07/2012    Procedure: REMOVAL OF INFLAMMED DENTIGEROUS CYST/IMPACTED #32;  Surgeon:  Isac Caddy, DDS;  Location: [facility];  Service: Oral Surgery;  Laterality: N/A;    Current Outpatient Prescriptions  Medication Sig Dispense Refill  . acetaminophen (TYLENOL) 500 MG tablet Take 500 mg by mouth every 6 (six) hours as needed for pain.      . diphenhydrAMINE (BENADRYL) 25 MG tablet Take 25 mg by mouth at bedtime.      . hydrochlorothiazide (HYDRODIURIL) 25 MG tablet Take 1 tablet (25 mg total) by mouth daily.  30 tablet  6  . ibuprofen (ADVIL,MOTRIN) 200 MG tablet Take 400 mg by mouth daily as needed for pain.       Marland Kitchen loperamide (IMODIUM) 2 MG capsule Take 2 mg by mouth 4 (four) times daily as needed for diarrhea or loose stools.      . ranitidine (ZANTAC) 150 MG tablet Take 150 mg by mouth 2 (two) times daily as needed for heartburn.      . simvastatin (ZOCOR) 10 MG tablet Take 1 tablet (10 mg total) by mouth at bedtime.  30 tablet  3  . Vitamin D, Ergocalciferol, (DRISDOL) 50000 UNITS CAPS capsule Take 1 capsule (50,000 Units total) by mouth every 7 (seven) days.  4 capsule  2   No current facility-administered medications for this visit.    Allergies as of 05/29/2013 - Review Complete 05/29/2013  Allergen Reaction Noted  . Codeine Other (See Comments) 09/30/2012    Family History  Problem Relation Age of Onset  . Heart disease Mother   . Cancer Mother     breast  . Diabetes Mother   . Hypertension Father   . Heart disease Father   . Cancer Father     prostate  . Colon cancer Neg Hx     however husband passed away from colon cancer 10 years ago    History   Social History  . Marital Status: Widowed    Spouse Name: N/A    Number of Children: N/A  . Years of Education: N/A   Occupational History  . OFFICE MANAGER      [employer]    Social History Main Topics  . Smoking status: Never Smoker   . Smokeless tobacco: Not on file  . Alcohol Use: No  . Drug Use: No  . Sexual Activity: No     Comment: Husband deceased   Other Topics Concern  . Not on  file   Social History Narrative  . No narrative on file    Review of Systems: As mentioned in HPI  Physical Exam: BP 136/88  Pulse 95  Temp(Src) 97.9 F (36.6 C) (Oral)  Ht 5\' 3"  (1.6 m)  Wt 145 lb 6.4 oz (65.953 kg)  BMI 25.76 kg/m2 General:   Alert and oriented. Well-developed, well-nourished, pleasant and cooperative. Head:  Normocephalic and atraumatic. Eyes:  Conjunctiva pink, sclera clear, no icterus.   Conjunctiva pink. Ears:  Normal auditory acuity. Nose:  No deformity, discharge,  or lesions. Mouth:  No deformity or lesions, mucosa pink and moist.  Neck:  Supple, without mass or thyromegaly. Lungs:  Clear to auscultation bilaterally, without wheezing, rales, or rhonchi.  Heart:  S1, S2 present without murmurs noted.  Abdomen:  +BS, soft, non-tender and non-distended. Without mass or HSM. No rebound or guarding. No hernias noted. Rectal:  Deferred  Msk:  Symmetrical without gross deformities. Normal posture. Extremities:  Without clubbing or edema. Neurologic:  Alert and  oriented x4;  grossly normal neurologically. Skin:  Intact, warm and dry without significant lesions or rashes Cervical Nodes:  No significant cervical adenopathy. Psych:  Alert and cooperative. Normal mood and affect.

## 2013-06-23 NOTE — Op Note (Signed)
[facility] [address] [place], [address]   COLONOSCOPY PROCEDURE REPORT  PATIENT: Lindsay Mccormick, Lindsay Mccormick  MR#:         [id number] BIRTHDATE: 1950/09/06 , 42  yrs. old GENDER: Female ENDOSCOPIST: R.  Garfield Cornea, MD FACP FACG REFERRED BY:  Vic Blackbird, M.D. PROCEDURE DATE:  06/23/2013 PROCEDURE:     Diagnostic ileocolonoscopy with biopsy  INDICATIONS: chronic diarrhea  INFORMED CONSENT:  The risks, benefits, alternatives and imponderables including but not limited to bleeding, perforation as well as the possibility of a missed lesion have been reviewed.  The potential for biopsy, lesion removal, etc. have also been discussed.  Questions have been answered.  All parties agreeable. Please see the history and physical in the medical record for more information.  MEDICATIONS: Versed 5 mg IV and Demerol 75 mg IV in divided doses. Zofran 4 mg IV  DESCRIPTION OF PROCEDURE:  After a digital rectal exam was performed, the EC-3890Li ([id number])  colonoscope was advanced from the anus through the rectum and colon to the area of the cecum, ileocecal valve and appendiceal orifice.  The cecum was deeply intubated.  These structures were well-seen and photographed for the record.  From the level of the cecum and ileocecal valve, the scope was slowly and cautiously withdrawn.  The mucosal surfaces were carefully surveyed utilizing scope tip deflection to facilitate fold flattening as needed.  The scope was pulled down into the rectum where a thorough examination including retroflexion was performed.    FINDINGS:  Adequate preparation Internal hemorrhoids; otherwise normal rectum. Scattered left-sided diverticula; the remainder of the colonic mucosa appeared normal. The distal 5 cm of terminal ileal mucosa also appeared normal.  THERAPEUTIC / DIAGNOSTIC MANEUVERS PERFORMED:  segment biopsies of the ascending and descending segments taken to evaluate for microscopic  colitis.  COMPLICATIONS: None  CECAL WITHDRAWAL TIME:  9 minutes  IMPRESSION:  Internal hemorrhoids; colonic diverticulosis; status post segmental biopsy  RECOMMENDATIONS: Followup on pathology. See EGD report.   _______________________________ eSigned:  R. Garfield Cornea, MD FACP [facility] 06/23/2013 10:10 AM   CC:

## 2013-06-23 NOTE — Discharge Instructions (Signed)
EGD Discharge instructions Please read the instructions outlined below and refer to this sheet in the next few weeks. These discharge instructions provide you with general information on caring for yourself after you leave the hospital. Your doctor may also give you specific instructions. While your treatment has been planned according to the most current medical practices available, unavoidable complications occasionally occur. If you have any problems or questions after discharge, please call your doctor. ACTIVITY  You may resume your regular activity but move at a slower pace for the next 24 hours.   Take frequent rest periods for the next 24 hours.   Walking will help expel (get rid of) the air and reduce the bloated feeling in your abdomen.   No driving for 24 hours (because of the anesthesia (medicine) used during the test).   You may shower.   Do not sign any important legal documents or operate any machinery for 24 hours (because of the anesthesia used during the test).  NUTRITION  Drink plenty of fluids.   You may resume your normal diet.   Begin with a light meal and progress to your normal diet.   Avoid alcoholic beverages for 24 hours or as instructed by your caregiver.  MEDICATIONS  You may resume your normal medications unless your caregiver tells you otherwise.  WHAT YOU CAN EXPECT TODAY  You may experience abdominal discomfort such as a feeling of fullness or gas pains.  FOLLOW-UP  Your doctor will discuss the results of your test with you.  SEEK IMMEDIATE MEDICAL ATTENTION IF ANY OF THE FOLLOWING OCCUR:  Excessive nausea (feeling sick to your stomach) and/or vomiting.   Severe abdominal pain and distention (swelling).   Trouble swallowing.   Temperature over 101 F (37.8 C).   Rectal bleeding or vomiting of blood.   Colonoscopy Discharge Instructions  Read the instructions outlined below and refer to this sheet in the next few weeks. These  discharge instructions provide you with general information on caring for yourself after you leave the hospital. Your doctor may also give you specific instructions. While your treatment has been planned according to the most current medical practices available, unavoidable complications occasionally occur. If you have any problems or questions after discharge, call Dr. Gala Romney at [phone]. ACTIVITY  You may resume your regular activity, but move at a slower pace for the next 24 hours.   Take frequent rest periods for the next 24 hours.   Walking will help get rid of the air and reduce the bloated feeling in your belly (abdomen).   No driving for 24 hours (because of the medicine (anesthesia) used during the test).    Do not sign any important legal documents or operate any machinery for 24 hours (because of the anesthesia used during the test).  NUTRITION  Drink plenty of fluids.   You may resume your normal diet as instructed by your doctor.   Begin with a light meal and progress to your normal diet. Heavy or fried foods are harder to digest and may make you feel sick to your stomach (nauseated).   Avoid alcoholic beverages for 24 hours or as instructed.  MEDICATIONS  You may resume your normal medications unless your doctor tells you otherwise.  WHAT YOU CAN EXPECT TODAY  Some feelings of bloating in the abdomen.   Passage of more gas than usual.   Spotting of blood in your stool or on the toilet paper.  IF YOU HAD POLYPS REMOVED DURING THE COLONOSCOPY:  No aspirin products for 7 days or as instructed.   No alcohol for 7 days or as instructed.   Eat a soft diet for the next 24 hours.  FINDING OUT THE RESULTS OF YOUR TEST Not all test results are available during your visit. If your test results are not back during the visit, make an appointment with your caregiver to find out the results. Do not assume everything is normal if you have not heard from your caregiver or the  medical facility. It is important for you to follow up on all of your test results.  SEEK IMMEDIATE MEDICAL ATTENTION IF:  You have more than a spotting of blood in your stool.   Your belly is swollen (abdominal distention).   You are nauseated or vomiting.   You have a temperature over 101.   You have abdominal pain or discomfort that is severe or gets worse throughout the day.   GERD information provided  Stop esomeprazole; trial of Dexilant 60 mg daily-go by my office for two-week supply of free samples  Diverticulosis information  Further recommendations to follow pending review of pathology report

## 2013-06-23 NOTE — Op Note (Signed)
[facility] [address] [place], [address]   ENDOSCOPY PROCEDURE REPORT  PATIENT: Lindsay Mccormick, Lindsay Mccormick  MR#: [id number] BIRTHDATE: 04/09/1950 , 63  yrs. old GENDER: Female ENDOSCOPIST: R.  Garfield Cornea, MD FACP FACG REFERRED BY:  Vic Blackbird, M.D. PROCEDURE DATE:  06/23/2013 PROCEDURE:     EGD with gastric biopsy  INDICATIONS:     Dyspepsia with nausea; nausea worsened with recent course of esomeprazole  INFORMED CONSENT:   The risks, benefits, limitations, alternatives and imponderables have been discussed.  The potential for biopsy, esophogeal dilation, etc. have also been reviewed.  Questions have been answered.  All parties agreeable.  Please see the history and physical in the medical record for more information.  MEDICATIONS:      Versed 3 mg IV and Demerol 75 mg IV in divided doses. Xylocaine gel orally. Zofran 4 mg IV.  DESCRIPTION OF PROCEDURE:   The EC-3890Li ([id number]) and EG-2990i ([id number])  endoscope was introduced through the mouth and advanced to the second portion of the duodenum without difficulty or limitations.  The mucosal surfaces were surveyed very carefully during advancement of the scope and upon withdrawal.  Retroflexion view of the proximal stomach and esophagogastric junction was performed.      FINDINGS: Tiny distal esophageal erosions straddling the GE junction. No Barrett's esophagus or other abnormality. Stomach empty. "Mosaicism" of the Proximal Gastric Mucosa. Scattered Antral Erosions. No Ulcer or Infiltrating Process. No Hiatal Hernia. Patent Pylorus. Examination of the Bulb and Second Portion Revealed Scattered Bulbar Erosions Only.  THERAPEUTIC / DIAGNOSTIC MANEUVERS PERFORMED:  Biopsies of the abnormal antrum and body taken for histologic study   COMPLICATIONS:  None  IMPRESSION:  Tiny distal esophageal erosions consistent with mild erosive reflux esophagitis.   Abnormal gastric and duodenal bulbar mucosa of  uncertain significance   -   status post gastric biopsy.  RECOMMENDATIONS: Stop esomeprazole; 2 week trial of Dexilant 60 mg orally daily. Followup on pathology. See colonoscopy report.    _______________________________ R. Garfield Cornea, MD FACP [facility] eSigned:  R. Garfield Cornea, MD FACP [facility] 06/23/2013 9:49 AM     CC:

## 2013-06-24 ENCOUNTER — Encounter: Payer: Self-pay | Admitting: Internal Medicine

## 2013-06-26 ENCOUNTER — Encounter (HOSPITAL_COMMUNITY): Payer: Self-pay | Admitting: Internal Medicine

## 2013-07-01 ENCOUNTER — Encounter: Payer: Self-pay | Admitting: Internal Medicine

## 2013-07-21 ENCOUNTER — Encounter: Payer: Self-pay | Admitting: Family Medicine

## 2013-07-21 ENCOUNTER — Ambulatory Visit (INDEPENDENT_AMBULATORY_CARE_PROVIDER_SITE_OTHER): Payer: BC Managed Care – PPO | Admitting: Family Medicine

## 2013-07-21 VITALS — BP 134/82 | HR 64 | Temp 98.0°F | Resp 14 | Ht 62.0 in | Wt 147.0 lb

## 2013-07-21 DIAGNOSIS — M8580 Other specified disorders of bone density and structure, unspecified site: Secondary | ICD-10-CM

## 2013-07-21 DIAGNOSIS — M899 Disorder of bone, unspecified: Secondary | ICD-10-CM

## 2013-07-21 DIAGNOSIS — E785 Hyperlipidemia, unspecified: Secondary | ICD-10-CM

## 2013-07-21 DIAGNOSIS — I10 Essential (primary) hypertension: Secondary | ICD-10-CM

## 2013-07-21 DIAGNOSIS — M949 Disorder of cartilage, unspecified: Secondary | ICD-10-CM

## 2013-07-21 LAB — COMPREHENSIVE METABOLIC PANEL
ALBUMIN: 4.3 g/dL (ref 3.5–5.2)
ALT: 15 U/L (ref 0–35)
AST: 13 U/L (ref 0–37)
Alkaline Phosphatase: 55 U/L (ref 39–117)
BUN: 8 mg/dL (ref 6–23)
CHLORIDE: 105 meq/L (ref 96–112)
CO2: 30 meq/L (ref 19–32)
Calcium: 9.9 mg/dL (ref 8.4–10.5)
Creat: 0.66 mg/dL (ref 0.50–1.10)
Glucose, Bld: 89 mg/dL (ref 70–99)
POTASSIUM: 4 meq/L (ref 3.5–5.3)
Sodium: 143 mEq/L (ref 135–145)
Total Bilirubin: 0.4 mg/dL (ref 0.2–1.2)
Total Protein: 7.1 g/dL (ref 6.0–8.3)

## 2013-07-21 LAB — LIPID PANEL
CHOLESTEROL: 219 mg/dL — AB (ref 0–200)
HDL: 64 mg/dL (ref >39)
LDL Cholesterol: 138 mg/dL — ABNORMAL HIGH (ref 0–99)
Total CHOL/HDL Ratio: 3.4 Ratio
Triglycerides: 86 mg/dL (ref <150)
VLDL: 17 mg/dL (ref 0–40)

## 2013-07-21 NOTE — Assessment & Plan Note (Signed)
Increase vitamin D to 1000IU I will obtain her last Bone Density

## 2013-07-21 NOTE — Assessment & Plan Note (Signed)
Well controlled, no change to meds 

## 2013-07-21 NOTE — Assessment & Plan Note (Signed)
D/C zocor due to SE Recheck lipids Red Yeast Rice

## 2013-07-21 NOTE — Patient Instructions (Signed)
F/U 6 months for Physical Release for Bone Density Records- [place] Stop the simvastatin Take Vitamin D 1000iu , Calcium 1200mg   For Cholesterol- Red Yeast Rice at bedtime We will call with results

## 2013-07-21 NOTE — Progress Notes (Signed)
Patient ID: Lindsay Mccormick, female   DOB: 03-Jun-1950, 63 y.o.   MRN: [id number]   Subjective:    Patient ID: Lindsay Mccormick, female    DOB: 1951/02/06, 63 y.o.   MRN: [id number]  Patient presents for 3 month F/U and F/U colonscopy Pt here to f/u chronic medical problems, reviewed colonoscopy with her. I have not seen her bone density results from March. Hyperlipidemia- started on zocor, she stopped due to leg pains and cramps at bedtime, has been trying to watch her diet    Review Of Systems:  GEN- denies fatigue, fever, weight loss,weakness, recent illness HEENT- denies eye drainage, change in vision, nasal discharge, CVS- denies chest pain, palpitations RESP- denies SOB, cough, wheeze ABD- denies N/V, change in stools, abd pain GU- denies dysuria, hematuria, dribbling, incontinence MSK- denies joint pain, muscle aches, injury Neuro- denies headache, dizziness, syncope, seizure activity       Objective:    BP 134/82  Pulse 64  Temp(Src) 98 F (36.7 C) (Oral)  Resp 14  Ht 5\' 2"  (1.575 m)  Wt 147 lb (66.679 kg)  BMI 26.88 kg/m2 GEN- NAD, alert and oriented x3 HEENT- PERRL, EOMI, non injected sclera, pink conjunctiva, MMM, oropharynx clear CVS- RRR, no murmur RESP-CTAB EXT- No edema Pulses- Radial 2+        Assessment & Plan:      Problem List Items Addressed This Visit   Osteopenia     Increase vitamin D to 1000IU I will obtain her last Bone Density    Hyperlipidemia     D/C zocor due to SE Recheck lipids Red Yeast Rice     Relevant Orders      Lipid panel (Completed)   Essential hypertension, benign - Primary     Well controlled, no change to meds    Relevant Orders      Comprehensive metabolic panel (Completed)      Vitamin D, 25-hydroxy (Completed)      Note: This dictation was prepared with Dragon dictation along with smaller phrase technology. Any transcriptional errors that result from this process are unintentional.

## 2013-07-22 LAB — VITAMIN D 25 HYDROXY (VIT D DEFICIENCY, FRACTURES): Vit D, 25-Hydroxy: 44 ng/mL (ref 30–89)

## 2013-07-25 ENCOUNTER — Other Ambulatory Visit: Payer: Self-pay | Admitting: *Deleted

## 2013-07-25 ENCOUNTER — Telehealth: Payer: Self-pay | Admitting: Family Medicine

## 2013-07-25 DIAGNOSIS — M8580 Other specified disorders of bone density and structure, unspecified site: Secondary | ICD-10-CM

## 2013-07-25 DIAGNOSIS — M810 Age-related osteoporosis without current pathological fracture: Secondary | ICD-10-CM

## 2013-07-25 MED ORDER — ALENDRONATE SODIUM 70 MG PO TABS: 70.0000 mg | ORAL_TABLET | ORAL | Status: DC

## 2013-07-25 NOTE — Telephone Encounter (Signed)
Per MD orders, fosamax weekly sent to pharmacy.

## 2013-07-25 NOTE — Telephone Encounter (Signed)
Please call pt her Bone Density shows Osteoporosis now -this is worse than previous, to prevent early hip fractures and spine fractures, I recommend she start Fosamax 70mg  1 tablet weekly,  And continue her calcium and Vitamin D- Vitamin D should be at least 1000IU like we discussed  Her Bone Density will be repeated in 1 year   Send script for Fosamax

## 2013-07-25 NOTE — Telephone Encounter (Signed)
Pt made aware of Bone Density exam.  Understands she now has Osteoporosis.  Given instructions on how to take Fosamax.  Continue Vitamin D, 1000 IU daily along with her Calcium supplements

## 2013-07-25 NOTE — Telephone Encounter (Signed)
LMTRC

## 2013-08-11 ENCOUNTER — Encounter: Payer: Self-pay | Admitting: Family Medicine

## 2013-08-12 ENCOUNTER — Encounter: Payer: Self-pay | Admitting: Gastroenterology

## 2013-08-12 ENCOUNTER — Ambulatory Visit (INDEPENDENT_AMBULATORY_CARE_PROVIDER_SITE_OTHER): Payer: BC Managed Care – PPO | Admitting: Gastroenterology

## 2013-08-12 VITALS — BP 131/88 | HR 84 | Temp 97.4°F | Resp 18 | Ht 63.5 in | Wt 146.0 lb

## 2013-08-12 DIAGNOSIS — K2100 Gastro-esophageal reflux disease with esophagitis, without bleeding: Secondary | ICD-10-CM

## 2013-08-12 DIAGNOSIS — R195 Other fecal abnormalities: Secondary | ICD-10-CM

## 2013-08-12 DIAGNOSIS — K219 Gastro-esophageal reflux disease without esophagitis: Secondary | ICD-10-CM | POA: Insufficient documentation

## 2013-08-12 MED ORDER — DEXLANSOPRAZOLE 60 MG PO CPDR: 60.0000 mg | DELAYED_RELEASE_CAPSULE | Freq: Every day | ORAL | Status: DC

## 2013-08-12 NOTE — Patient Instructions (Signed)
Continue Dexilant once daily. I have provided samples and a saving card to activate before you pick up your prescription.  Continue to take Bentyl before meals and at bedtime as needed for loose stool and cramping.   We will see you back in 1 year!

## 2013-08-12 NOTE — Progress Notes (Signed)
Referring Provider: Alycia Rossetti, MD Primary Care Physician:  Vic Blackbird, MD Primary GI: Dr. Gala Romney   Chief Complaint  Patient presents with  . Follow-up    HPI:   Lindsay Mccormick presents today in follow-up after EGD and colonoscopy performed secondary to frequent loose stools, early satiety. Colonoscopy overall benign, negative colonic biopsies. EGD with mild erosive reflux esophagitis and negative H.pylori. Some postprandial loose stool. Did well in [place]. Dexilant daily, nausea and appetite improved. Taking Bentyl with improvement. Tried/failed Prilosec, Prevacid. Overall feels better.   Past Medical History  Diagnosis Date  . Hemorrhoids     Internal;uses Proctofoam bid  . Allergy     takes OTC med nightly and Benadryl nightly  . Cardiac murmur     Benign  . Hyperlipidemia     borderline but no meds at present  . GERD (gastroesophageal reflux disease)     takes Zantac daily  . Diarrhea     takes Imodium daily  . PONV (postoperative nausea and vomiting)   . Hypertension     takes HCTZ daily  . Headache(784.0)     rarely  . Arthritis   . Joint pain     Past Surgical History  Procedure Laterality Date  . Abdominal hysterectomy    . Cholecystectomy      at least 10 years ago   . Cesarean section      x 2  . Colonoscopy  2008    Dr. Audie Box in [place]  . Appendectomy    . Diagnostic laparoscopy    . Dilation and curettage of uterus    . Jaw biopsy    . Tooth extraction N/A 10/07/2012    Procedure: REMOVAL OF INFLAMMED DENTIGEROUS CYST/IMPACTED #32;  Surgeon: Isac Caddy, DDS;  Location: [facility];  Service: Oral Surgery;  Laterality: N/A;  . Colonoscopy N/A 06/23/2013    Dr. Gala Romney: internal hemorrhoids, colonic diverticulosis, negative colonic biopsies  . Esophagogastroduodenoscopy N/A 06/23/2013    Dr. Gala Romney: mild erosive reflux esophagitis, gastric biopsy with chronic inflammation    Current Outpatient Prescriptions  Medication Sig  Dispense Refill  . acetaminophen (TYLENOL) 500 MG tablet Take 500 mg by mouth every 6 (six) hours as needed for pain.      Marland Kitchen alendronate (FOSAMAX) 70 MG tablet Take 1 tablet (70 mg total) by mouth every 7 (seven) days. Take with a full glass of water on an empty stomach.  4 tablet  11  . dexlansoprazole (DEXILANT) 60 MG capsule Take 1 capsule (60 mg total) by mouth daily.  30 capsule  11  . dicyclomine (BENTYL) 10 MG capsule Take 10 mg by mouth 3 (three) times daily as needed for spasms. For loose stool as needed      . fluticasone (FLONASE) 50 MCG/ACT nasal spray Place 1 spray into both nostrils daily.      . hydrochlorothiazide (HYDRODIURIL) 25 MG tablet Take 1 tablet (25 mg total) by mouth daily.  30 tablet  6  . ibuprofen (ADVIL,MOTRIN) 200 MG tablet Take 400 mg by mouth daily as needed for mild pain.       Marland Kitchen loperamide (IMODIUM) 2 MG capsule Take 2 mg by mouth 4 (four) times daily as needed for diarrhea or loose stools.      . Multiple Vitamin (MULTIVITAMIN WITH MINERALS) TABS tablet Take 1 tablet by mouth daily.       No current facility-administered medications for this visit.    Allergies as of 08/12/2013 -  Review Complete 08/12/2013  Allergen Reaction Noted  . Codeine Nausea And Vomiting 09/30/2012    Family History  Problem Relation Age of Onset  . Heart disease Mother   . Cancer Mother     breast  . Diabetes Mother   . Hypertension Father   . Heart disease Father   . Cancer Father     prostate  . Colon cancer Neg Hx     however husband passed away from colon cancer 10 years ago    History   Social History  . Marital Status: Widowed    Spouse Name: N/A    Number of Children: N/A  . Years of Education: N/A   Occupational History  . OFFICE MANAGER      [employer]    Social History Main Topics  . Smoking status: Never Smoker   . Smokeless tobacco: Never Used  . Alcohol Use: No  . Drug Use: No  . Sexual Activity: No     Comment: Husband deceased   Other Topics  Concern  . None   Social History Narrative  . None    Review of Systems: As mentioned in HPI  Physical Exam: BP 131/88  Pulse 84  Temp(Src) 97.4 F (36.3 C) (Oral)  Resp 18  Ht 5' 3.5" (1.613 m)  Wt 146 lb (66.225 kg)  BMI 25.45 kg/m2 General:   Alert and oriented. No distress noted. Pleasant and cooperative.  Abdomen:  +BS, soft, non-tender and non-distended. No rebound or guarding. No HSM or masses noted. Msk:  Symmetrical without gross deformities. Normal posture. Extremities:  Without edema. Neurologic:  Alert and  oriented x4;  grossly normal neurologically. Skin:  Intact without significant lesions or rashes. Psych:  Alert and cooperative. Normal mood and affect.

## 2013-08-13 ENCOUNTER — Encounter: Payer: Self-pay | Admitting: Gastroenterology

## 2013-08-13 NOTE — Progress Notes (Signed)
cc'd to pcp 

## 2013-08-13 NOTE — Assessment & Plan Note (Signed)
With esophagitis on EGD. Improved symptoms of early satiety with Dexilant. Continue Dexilant once daily. Return in 1 year or sooner if needed.

## 2013-08-13 NOTE — Assessment & Plan Note (Signed)
Negative colonoscopy. Improved with Bentyl. Continue Bentyl prn. Return in 1 year or sooner as needed.

## 2014-03-14 ENCOUNTER — Other Ambulatory Visit: Payer: Self-pay | Admitting: Family Medicine

## 2014-05-12 ENCOUNTER — Other Ambulatory Visit: Payer: Self-pay | Admitting: Family Medicine

## 2014-05-12 ENCOUNTER — Encounter: Payer: Self-pay | Admitting: Family Medicine

## 2014-05-12 ENCOUNTER — Ambulatory Visit (INDEPENDENT_AMBULATORY_CARE_PROVIDER_SITE_OTHER): Payer: BLUE CROSS/BLUE SHIELD | Admitting: Family Medicine

## 2014-05-12 VITALS — BP 130/78 | HR 72 | Temp 97.8°F | Resp 16 | Ht 63.0 in | Wt 144.0 lb

## 2014-05-12 DIAGNOSIS — E785 Hyperlipidemia, unspecified: Secondary | ICD-10-CM | POA: Diagnosis not present

## 2014-05-12 DIAGNOSIS — I10 Essential (primary) hypertension: Secondary | ICD-10-CM

## 2014-05-12 DIAGNOSIS — R791 Abnormal coagulation profile: Secondary | ICD-10-CM

## 2014-05-12 DIAGNOSIS — R079 Chest pain, unspecified: Secondary | ICD-10-CM

## 2014-05-12 DIAGNOSIS — J302 Other seasonal allergic rhinitis: Secondary | ICD-10-CM | POA: Diagnosis not present

## 2014-05-12 DIAGNOSIS — R7989 Other specified abnormal findings of blood chemistry: Secondary | ICD-10-CM

## 2014-05-12 NOTE — Assessment & Plan Note (Signed)
Check TFT today

## 2014-05-12 NOTE — Patient Instructions (Signed)
CT chest to be done  Continue current medications Release of records- Urgent Care F/u 6 months for PHYSICAL

## 2014-05-12 NOTE — Assessment & Plan Note (Addendum)
I think this is more of a false positive, no current symptoms, however she did have recent travel,   discussed with pt since she did have chest discomfort she would like to proceed with CT of chest,

## 2014-05-12 NOTE — Assessment & Plan Note (Signed)
Continue HCTZ, no hypotension noted

## 2014-05-12 NOTE — Progress Notes (Signed)
Patient ID: Lindsay Mccormick, female   DOB: 01-01-1951, 64 y.o.   MRN: [id number]   Subjective:    Patient ID: Lindsay Mccormick, female    DOB: 11-20-1950, 64 y.o.   MRN: [id number]  Patient presents for Urgent Care F/U  patient to follow-up from the urgent care. On Saturday she went to the urgent care after having a couple days of dizzy episodes and chest discomfort. She has significant workup which included EKG and chest x-ray which were unremarkable per report. She also have blood work done including a troponin and d-dimer. Her d-dimer was elevated and she was supposed to be transferred to the emergency room however she declined and wants come to my office so that I could do this test for her. Her chest discomfort and dizziness is actually resolved. They did treat for sinusitis with Omnicef and she has been taking her allergy medication. She did have some recent travel where she took a long bus ride down to [place] which was couple weeks ago. Otherwise she's not had any leg swelling or shortness of breath with exertion and no hemoptysis. No known malignancy.    Review Of Systems:  GEN- denies fatigue, fever, weight loss,weakness, recent illness HEENT- denies eye drainage, change in vision, nasal discharge, CVS-+ chest pain, palpitations RESP- denies SOB, cough, wheeze ABD- denies N/V, change in stools, abd pain GU- denies dysuria, hematuria, dribbling, incontinence MSK- denies joint pain, muscle aches, injury Neuro- denies headache,+ dizziness, syncope, seizure activity       Objective:    BP 130/78 mmHg  Pulse 72  Temp(Src) 97.8 F (36.6 C) (Oral)  Resp 16  Ht 5\' 3"  (1.6 m)  Wt 144 lb (65.318 kg)  BMI 25.51 kg/m2 GEN- NAD, alert and oriented x3 HEENT- PERRL, EOMI, non injected sclera, pink conjunctiva, MMM, oropharynx clear Neck- Supple, no bruit, no thyromegaly CVS- RRR, no murmur RESP-CTAB EXT- No edema, neg homans Pulses- Radial, DP- 2+        Assessment & Plan:       Problem List Items Addressed This Visit      Unprioritized   Seasonal allergies    Continue flonase      Positive D dimer    I think this is more of a false positive, no current symptoms, however she did have recent travel,   discussed with pt since she did have chest discomfort she would like to proceed with CT of chest,        Relevant Orders   CT Angio Chest PE W/Cm &/Or Wo Cm   Hyperlipidemia    Check TFT today      Relevant Orders   Lipid panel   Essential hypertension, benign - Primary    Continue HCTZ, no hypotension noted      Relevant Orders   CBC with Differential/Platelet   Comprehensive metabolic panel   TSH    Other Visit Diagnoses    Chest pain, unspecified chest pain type        Relevant Orders    CT Angio Chest PE W/Cm &/Or Wo Cm       Note: This dictation was prepared with Dragon dictation along with smaller phrase technology. Any transcriptional errors that result from this process are unintentional.

## 2014-05-12 NOTE — Assessment & Plan Note (Signed)
Continue flonase 

## 2014-05-13 LAB — CBC WITH DIFFERENTIAL/PLATELET
BASOS ABS: 0.1 10*3/uL (ref 0.0–0.1)
Basophils Relative: 1 % (ref 0–1)
Eosinophils Absolute: 0.1 10*3/uL (ref 0.0–0.7)
Eosinophils Relative: 1 % (ref 0–5)
HCT: 38 % (ref 36.0–46.0)
Hemoglobin: 13 g/dL (ref 12.0–15.0)
Lymphocytes Relative: 53 % — ABNORMAL HIGH (ref 12–46)
Lymphs Abs: 3.1 10*3/uL (ref 0.7–4.0)
MCH: 30.1 pg (ref 26.0–34.0)
MCHC: 34.2 g/dL (ref 30.0–36.0)
MCV: 88 fL (ref 78.0–100.0)
MPV: 9.4 fL (ref 8.6–12.4)
Monocytes Absolute: 0.4 10*3/uL (ref 0.1–1.0)
Monocytes Relative: 7 % (ref 3–12)
NEUTROS ABS: 2.2 10*3/uL (ref 1.7–7.7)
NEUTROS PCT: 38 % — AB (ref 43–77)
PLATELETS: 328 10*3/uL (ref 150–400)
RBC: 4.32 MIL/uL (ref 3.87–5.11)
RDW: 13.4 % (ref 11.5–15.5)
WBC: 5.8 10*3/uL (ref 4.0–10.5)

## 2014-05-13 LAB — LIPID PANEL
CHOL/HDL RATIO: 4.4 ratio
Cholesterol: 240 mg/dL — ABNORMAL HIGH (ref 0–200)
HDL: 55 mg/dL (ref >=46)
LDL Cholesterol: 145 mg/dL — ABNORMAL HIGH (ref 0–99)
Triglycerides: 201 mg/dL — ABNORMAL HIGH (ref <150)
VLDL: 40 mg/dL (ref 0–40)

## 2014-05-13 LAB — COMPREHENSIVE METABOLIC PANEL
ALT: 19 U/L (ref 0–35)
AST: 17 U/L (ref 0–37)
Albumin: 4.8 g/dL (ref 3.5–5.2)
Alkaline Phosphatase: 39 U/L (ref 39–117)
BUN: 10 mg/dL (ref 6–23)
CHLORIDE: 100 meq/L (ref 96–112)
CO2: 31 meq/L (ref 19–32)
Calcium: 10.4 mg/dL (ref 8.4–10.5)
Creat: 0.66 mg/dL (ref 0.50–1.10)
Glucose, Bld: 97 mg/dL (ref 70–99)
POTASSIUM: 3.6 meq/L (ref 3.5–5.3)
Sodium: 140 mEq/L (ref 135–145)
TOTAL PROTEIN: 7.5 g/dL (ref 6.0–8.3)
Total Bilirubin: 0.4 mg/dL (ref 0.2–1.2)

## 2014-05-13 LAB — TSH: TSH: 0.776 u[IU]/mL (ref 0.350–4.500)

## 2014-05-14 LAB — T3, FREE: T3 FREE: 3 pg/mL (ref 2.3–4.2)

## 2014-05-14 LAB — T4, FREE: Free T4: 1.23 ng/dL (ref 0.80–1.80)

## 2014-05-18 ENCOUNTER — Other Ambulatory Visit: Payer: Self-pay | Admitting: *Deleted

## 2014-05-18 DIAGNOSIS — E785 Hyperlipidemia, unspecified: Secondary | ICD-10-CM

## 2014-05-18 DIAGNOSIS — E039 Hypothyroidism, unspecified: Secondary | ICD-10-CM

## 2014-05-20 ENCOUNTER — Telehealth: Payer: Self-pay | Admitting: Internal Medicine

## 2014-05-20 ENCOUNTER — Ambulatory Visit (HOSPITAL_COMMUNITY)
Admission: RE | Admit: 2014-05-20 | Discharge: 2014-05-20 | Disposition: A | Discharge status: Home / Self Care | Payer: BLUE CROSS/BLUE SHIELD | Source: Ambulatory Visit | Attending: Family Medicine | Admitting: Family Medicine

## 2014-05-20 ENCOUNTER — Encounter (HOSPITAL_COMMUNITY): Payer: Self-pay

## 2014-05-20 DIAGNOSIS — R791 Abnormal coagulation profile: Secondary | ICD-10-CM | POA: Diagnosis present

## 2014-05-20 DIAGNOSIS — R079 Chest pain, unspecified: Secondary | ICD-10-CM | POA: Diagnosis not present

## 2014-05-20 DIAGNOSIS — R7989 Other specified abnormal findings of blood chemistry: Secondary | ICD-10-CM

## 2014-05-20 MED ORDER — IOHEXOL 350 MG/ML SOLN
100.0000 mL | Freq: Once | INTRAVENOUS | Status: AC | PRN
  Administered 2014-05-20: 100 mL via INTRAVENOUS

## 2014-05-20 NOTE — Telephone Encounter (Signed)
PATIENT NEEDS DEXILANT SAMPLES 60MG .  PLEASE CALL WHEN THEY COME IN [phone] AND A FAMILY MEMBER WILL COME PICK THEM UP.

## 2014-05-27 ENCOUNTER — Telehealth: Payer: Self-pay | Admitting: Family Medicine

## 2014-05-27 NOTE — Telephone Encounter (Signed)
Patient has questions about her bill please call her at  [phone]

## 2014-05-28 NOTE — Telephone Encounter (Signed)
#  4 boxes of dexilant are at the front desk. Tried to call pt- LMOM with information. Reminded her that if she comes on Friday to come before 12 noon. If she calls back, let her know please.

## 2014-05-28 NOTE — Telephone Encounter (Signed)
noted 

## 2014-05-28 NOTE — Telephone Encounter (Signed)
I called and spoke with patient she states that she has received a letter from [facility] stating they aren't approving her CT scan. She would like for [place] to appeal this with [employer]. She is faxing over the information that she received in the mail. I have asked her to fax it to Sabrina's attention so we can attempt to appeal their decision. In the referral it shows we had authorization.

## 2014-05-28 NOTE — Telephone Encounter (Signed)
Ok

## 2014-06-09 NOTE — Telephone Encounter (Signed)
Received statement from pt  stating that Case [id number] for computed Tomographic angiography has been denied on covered dates of 05/13/14- 06/11/14 pt was inquiring about it wanting if we can get appeal on this.   I received a letter from [place] pts' insurance stating that Case no: [id number] has been approved for Computed Tomographic angiography on covered dates of 05/18/14-06/16/14. I explained to pt what might have happened was during the period of 05/13/14-06/11/14 is when the exam went into medical review and once after review it was approved and she hasnt received that confirmation yet.   Pt states she will stay ahead of it and make sure her insurance covers it.

## 2014-07-07 ENCOUNTER — Encounter: Payer: Self-pay | Admitting: Internal Medicine

## 2014-07-09 ENCOUNTER — Encounter: Payer: Self-pay | Admitting: Family Medicine

## 2014-08-16 ENCOUNTER — Other Ambulatory Visit: Payer: Self-pay | Admitting: Family Medicine

## 2014-08-17 NOTE — Telephone Encounter (Signed)
Refill appropriate and filled per protocol. 

## 2014-11-13 ENCOUNTER — Encounter: Payer: BLUE CROSS/BLUE SHIELD | Admitting: Family Medicine

## 2014-11-16 ENCOUNTER — Encounter: Payer: Self-pay | Admitting: Family Medicine

## 2014-11-20 ENCOUNTER — Encounter: Payer: BLUE CROSS/BLUE SHIELD | Admitting: Family Medicine

## 2014-12-15 ENCOUNTER — Ambulatory Visit (INDEPENDENT_AMBULATORY_CARE_PROVIDER_SITE_OTHER): Payer: BLUE CROSS/BLUE SHIELD | Admitting: Family Medicine

## 2014-12-15 ENCOUNTER — Encounter: Payer: Self-pay | Admitting: Family Medicine

## 2014-12-15 VITALS — BP 124/62 | HR 70 | Temp 98.1°F | Resp 14 | Ht 63.0 in | Wt 147.0 lb

## 2014-12-15 DIAGNOSIS — E039 Hypothyroidism, unspecified: Secondary | ICD-10-CM | POA: Diagnosis not present

## 2014-12-15 DIAGNOSIS — I10 Essential (primary) hypertension: Secondary | ICD-10-CM | POA: Diagnosis not present

## 2014-12-15 DIAGNOSIS — E785 Hyperlipidemia, unspecified: Secondary | ICD-10-CM | POA: Diagnosis not present

## 2014-12-15 DIAGNOSIS — M810 Age-related osteoporosis without current pathological fracture: Secondary | ICD-10-CM | POA: Diagnosis not present

## 2014-12-15 DIAGNOSIS — Z0000 Encounter for general adult medical examination without abnormal findings: Secondary | ICD-10-CM

## 2014-12-15 LAB — COMPLETE METABOLIC PANEL WITH GFR
ALT: 15 U/L (ref 6–29)
AST: 15 U/L (ref 10–35)
Albumin: 4.3 g/dL (ref 3.6–5.1)
Alkaline Phosphatase: 37 U/L (ref 33–130)
BUN: 6 mg/dL — AB (ref 7–25)
CALCIUM: 9.7 mg/dL (ref 8.6–10.4)
CHLORIDE: 102 mmol/L (ref 98–110)
CO2: 30 mmol/L (ref 20–31)
Creat: 0.63 mg/dL (ref 0.50–0.99)
GFR, Est African American: >89 mL/min (ref >=60)
GFR, Est Non African American: >89 mL/min (ref >=60)
GLUCOSE: 100 mg/dL — AB (ref 70–99)
Potassium: 3.7 mmol/L (ref 3.5–5.3)
SODIUM: 141 mmol/L (ref 135–146)
Total Bilirubin: 0.4 mg/dL (ref 0.2–1.2)
Total Protein: 7.2 g/dL (ref 6.1–8.1)

## 2014-12-15 LAB — CBC WITH DIFFERENTIAL/PLATELET
Basophils Absolute: 0 10*3/uL (ref 0.0–0.1)
Basophils Relative: 0 % (ref 0–1)
EOS ABS: 0.1 10*3/uL (ref 0.0–0.7)
EOS PCT: 2 % (ref 0–5)
HCT: 37.3 % (ref 36.0–46.0)
Hemoglobin: 12.6 g/dL (ref 12.0–15.0)
LYMPHS ABS: 3 10*3/uL (ref 0.7–4.0)
Lymphocytes Relative: 54 % — ABNORMAL HIGH (ref 12–46)
MCH: 29.9 pg (ref 26.0–34.0)
MCHC: 33.8 g/dL (ref 30.0–36.0)
MCV: 88.4 fL (ref 78.0–100.0)
MONOS PCT: 7 % (ref 3–12)
MPV: 9.5 fL (ref 8.6–12.4)
Monocytes Absolute: 0.4 10*3/uL (ref 0.1–1.0)
Neutro Abs: 2 10*3/uL (ref 1.7–7.7)
Neutrophils Relative %: 37 % — ABNORMAL LOW (ref 43–77)
PLATELETS: 246 10*3/uL (ref 150–400)
RBC: 4.22 MIL/uL (ref 3.87–5.11)
RDW: 13.4 % (ref 11.5–15.5)
WBC: 5.5 10*3/uL (ref 4.0–10.5)

## 2014-12-15 LAB — T4, FREE: FREE T4: 0.98 ng/dL (ref 0.80–1.80)

## 2014-12-15 LAB — T3, FREE: T3, Free: 2.9 pg/mL (ref 2.3–4.2)

## 2014-12-15 LAB — LIPID PANEL
Cholesterol: 221 mg/dL — ABNORMAL HIGH (ref 125–200)
HDL: 50 mg/dL (ref >=46)
LDL Cholesterol: 143 mg/dL — ABNORMAL HIGH (ref <130)
Total CHOL/HDL Ratio: 4.4 Ratio (ref <=5.0)
Triglycerides: 140 mg/dL (ref <150)
VLDL: 28 mg/dL (ref <30)

## 2014-12-15 LAB — TSH: TSH: 0.524 u[IU]/mL (ref 0.350–4.500)

## 2014-12-15 MED ORDER — HYDROCHLOROTHIAZIDE 25 MG PO TABS: ORAL_TABLET | ORAL | Status: DC

## 2014-12-15 MED ORDER — ALENDRONATE SODIUM 70 MG PO TABS: ORAL_TABLET | ORAL | Status: DC

## 2014-12-15 NOTE — Patient Instructions (Addendum)
Bone Density to be done  I will obtain Mammogram  We will call with fasting labs  F/U 6 months

## 2014-12-15 NOTE — Progress Notes (Signed)
Patient ID: Lindsay Mccormick, female   DOB: 09-Jul-1950, 64 y.o.   MRN: [id number]   Subjective:    Patient ID: Lindsay Mccormick, female    DOB: 1950/02/25, 64 y.o.   MRN: [id number]  Patient presents for CPE  issue here for complete physical exam. She is no particular concerns. She states she is doing well. She is taking her medicines as prescribed. She had mammogram done in [place]. She is due for a bone density she is currently on Fosamax.  She is using her acid reflux medicine as needed.  Status post hysterectomy where needed.  Colonoscopy is up-to-date.  Declines shingles vaccine Will give flu shot a pharmacy  She also had slightly abnormal thyroid labs this will be rechecked today.  Review Of Systems:  GEN- denies fatigue, fever, weight loss,weakness, recent illness HEENT- denies eye drainage, change in vision, nasal discharge, CVS- denies chest pain, palpitations RESP- denies SOB, cough, wheeze ABD- denies N/V, change in stools, abd pain GU- denies dysuria, hematuria, dribbling, incontinence MSK- denies joint pain, muscle aches, injury Neuro- denies headache, dizziness, syncope, seizure activity       Objective:    BP 124/62 mmHg  Pulse 70  Temp(Src) 98.1 F (36.7 C) (Oral)  Resp 14  Ht 5\' 3"  (1.6 m)  Wt 147 lb (66.679 kg)  BMI 26.05 kg/m2 GEN- NAD, alert and oriented x3 HEENT- PERRL, EOMI, non injected sclera, pink conjunctiva, MMM, oropharynx clear Neck- Supple, no thyromegaly CVS- RRR, no murmur RESP-CTAB ABD-NABS,soft,NT,ND EXT- No edema Pulses- Radial, DP- 2+        Assessment & Plan:      Problem List Items Addressed This Visit    Osteoporosis   Relevant Medications   alendronate (FOSAMAX) 70 MG tablet   Other Relevant Orders   DG Bone Density   Hyperlipidemia - Primary   Relevant Medications   hydrochlorothiazide (HYDRODIURIL) 25 MG tablet   Essential hypertension, benign    Blood pressure well controlled continue current medication       Relevant Medications   hydrochlorothiazide (HYDRODIURIL) 25 MG tablet   Other Relevant Orders   CBC with Differential    Other Visit Diagnoses    Routine general medical examination at a health care facility        CPE done, obtain mammo report, Bone Density to be done, fasting labs    Hypothyroidism, unspecified hypothyroidism type           Note: This dictation was prepared with Dragon dictation along with smaller phrase technology. Any transcriptional errors that result from this process are unintentional.    '

## 2014-12-15 NOTE — Assessment & Plan Note (Signed)
Blood pressure well controlled continue current medication 

## 2014-12-16 ENCOUNTER — Telehealth: Payer: Self-pay | Admitting: *Deleted

## 2014-12-16 NOTE — Telephone Encounter (Signed)
Schedule Bone Density in [facility]  Per Dr. Buelah Manis  I called and left message at Radiology dept at [facility] @ [phone] to return my call to schedule this appt

## 2014-12-18 NOTE — Telephone Encounter (Signed)
Received call back from Imaging center in [place] and stated pt last had Bone Density in 04/22/2013, and is not due until 04/2015

## 2014-12-18 NOTE — Telephone Encounter (Signed)
She is on active treatment for Osteoporosis, so needs bone density this year to see if improving

## 2014-12-18 NOTE — Telephone Encounter (Signed)
Called again today to the breast center at [facility] left message to return my call.

## 2014-12-21 NOTE — Telephone Encounter (Signed)
Called [facility] to return my call..12/21/14

## 2014-12-22 NOTE — Telephone Encounter (Signed)
Pt has appt scheduled for Tues Nov 8th at [facility], pt is aware of appt I faxed order to [phone]

## 2014-12-29 LAB — HM DEXA SCAN

## 2015-02-05 ENCOUNTER — Encounter: Payer: Self-pay | Admitting: Family Medicine

## 2015-12-16 ENCOUNTER — Other Ambulatory Visit: Payer: Self-pay | Admitting: Family Medicine

## 2015-12-19 ENCOUNTER — Other Ambulatory Visit: Payer: Self-pay | Admitting: Family Medicine

## 2016-01-28 ENCOUNTER — Ambulatory Visit (INDEPENDENT_AMBULATORY_CARE_PROVIDER_SITE_OTHER): Payer: BLUE CROSS/BLUE SHIELD | Admitting: Family Medicine

## 2016-01-28 ENCOUNTER — Encounter: Payer: Self-pay | Admitting: Family Medicine

## 2016-01-28 VITALS — BP 132/72 | HR 82 | Temp 98.3°F | Resp 12 | Ht 63.0 in | Wt 140.0 lb

## 2016-01-28 DIAGNOSIS — R5383 Other fatigue: Secondary | ICD-10-CM

## 2016-01-28 DIAGNOSIS — H9311 Tinnitus, right ear: Secondary | ICD-10-CM | POA: Diagnosis not present

## 2016-01-28 DIAGNOSIS — I10 Essential (primary) hypertension: Secondary | ICD-10-CM

## 2016-01-28 DIAGNOSIS — M1990 Unspecified osteoarthritis, unspecified site: Secondary | ICD-10-CM

## 2016-01-28 DIAGNOSIS — M810 Age-related osteoporosis without current pathological fracture: Secondary | ICD-10-CM

## 2016-01-28 DIAGNOSIS — L989 Disorder of the skin and subcutaneous tissue, unspecified: Secondary | ICD-10-CM

## 2016-01-28 LAB — CBC WITH DIFFERENTIAL/PLATELET
BASOS PCT: 0 %
Basophils Absolute: 0 cells/uL (ref 0–200)
EOS ABS: 108 {cells}/uL (ref 15–500)
Eosinophils Relative: 2 %
HEMATOCRIT: 39.1 % (ref 35.0–45.0)
HEMOGLOBIN: 12.8 g/dL (ref 12.0–15.0)
LYMPHS ABS: 2430 {cells}/uL (ref 850–3900)
LYMPHS PCT: 45 %
MCH: 30 pg (ref 27.0–33.0)
MCHC: 32.7 g/dL (ref 32.0–36.0)
MCV: 91.8 fL (ref 80.0–100.0)
MONO ABS: 324 {cells}/uL (ref 200–950)
MPV: 8.6 fL (ref 7.5–12.5)
Monocytes Relative: 6 %
NEUTROS ABS: 2538 {cells}/uL (ref 1500–7800)
Neutrophils Relative %: 47 %
Platelets: 276 10*3/uL (ref 140–400)
RBC: 4.26 MIL/uL (ref 3.80–5.10)
RDW: 13.7 % (ref 11.0–15.0)
WBC: 5.4 10*3/uL (ref 3.8–10.8)

## 2016-01-28 LAB — LIPID PANEL
CHOL/HDL RATIO: 3.6 ratio (ref <5.0)
Cholesterol: 225 mg/dL — ABNORMAL HIGH (ref <200)
HDL: 63 mg/dL (ref >50)
LDL CALC: 131 mg/dL — AB (ref <100)
TRIGLYCERIDES: 153 mg/dL — AB (ref <150)
VLDL: 31 mg/dL — AB (ref <30)

## 2016-01-28 LAB — COMPREHENSIVE METABOLIC PANEL
ALBUMIN: 4.6 g/dL (ref 3.6–5.1)
ALT: 12 U/L (ref 6–29)
AST: 14 U/L (ref 10–35)
Alkaline Phosphatase: 35 U/L (ref 33–130)
BUN: 8 mg/dL (ref 7–25)
CHLORIDE: 102 mmol/L (ref 98–110)
CO2: 28 mmol/L (ref 20–31)
CREATININE: 0.7 mg/dL (ref 0.50–0.99)
Calcium: 9.8 mg/dL (ref 8.6–10.4)
Glucose, Bld: 111 mg/dL — ABNORMAL HIGH (ref 70–99)
POTASSIUM: 3.9 mmol/L (ref 3.5–5.3)
SODIUM: 143 mmol/L (ref 135–146)
Total Bilirubin: 0.6 mg/dL (ref 0.2–1.2)
Total Protein: 7.2 g/dL (ref 6.1–8.1)

## 2016-01-28 LAB — TSH: TSH: 1.47 m[IU]/L

## 2016-01-28 LAB — VITAMIN B12: Vitamin B-12: 543 pg/mL (ref 200–1100)

## 2016-01-28 MED ORDER — TRIAMCINOLONE ACETONIDE 0.1 % EX CREA: 1.0000 "application " | TOPICAL_CREAM | Freq: Two times a day (BID) | CUTANEOUS | 0 refills | Status: AC

## 2016-01-28 NOTE — Patient Instructions (Addendum)
Bone Density- to be set up [facility] We will call with labs Use the steroid cream, if this does not help dermatology referral to be done  F/U 6 months Physical

## 2016-01-28 NOTE — Progress Notes (Signed)
   Subjective:    Patient ID: Lindsay Mccormick, female    DOB: 24-Aug-1950, 65 y.o.   MRN: [id number]  Patient presents for Arthritis Pain (increased pain in joints); Fatigue (reports that she is tired all the time); and Tinnitis (reports B ears ringing) Patient here with complaints of fatigue arthritis pain. She was last seen a little over year ago. She gets pain in her elbows, wrist. No swelling, she does play the piano at church. No tingling/numbness in her fingertips.  Right ear rining, hearing in tact , going on for the past month or so  Fatigued a lot, she has some stress with her children, her sleep is fair, tries to stay active. Denies depression.  Osteoporosis on fosamax for 1 year, due for repeat Bone Density   Also has itchy spot behind right ear in scalp, hair is thinning, has been present > 1 month  Review Of Systems:  GEN- denies fatigue, fever, weight loss,weakness, recent illness HEENT- denies eye drainage, change in vision, nasal discharge, CVS- denies chest pain, palpitations RESP- denies SOB, cough, wheeze ABD- denies N/V, change in stools, abd pain GU- denies dysuria, hematuria, dribbling, incontinence MSK- +joint pain, muscle aches, injury Neuro- denies headache, dizziness, syncope, seizure activity       Objective:    BP 132/72 (BP Location: Left Arm, Patient Position: Sitting, Cuff Size: Normal)   Pulse 82   Temp 98.3 F (36.8 C) (Oral)   Resp 12   Ht 5\' 3"  (1.6 m)   Wt 140 lb (63.5 kg)   SpO2 99%   BMI 24.80 kg/m  GEN- NAD, alert and oriented x3 HEENT- PERRL, EOMI, non injected sclera, pink conjunctiva, MMM, oropharynx clear, mild wax impaction bilat - s/p irrigation hearing screen clear, denies any ringing  Neck- Supple, no thyromegaly CVS- RRR, no murmur RESP-CTAB MSK- Bilat elbows no swelling, FROM upper ext, wrist, fair ROM bilat knees Skin- hyperpigmented plaque like lesion with some scales behind right ear, alopecia in area of skin lesion, NT,  no bleeding  EXT- No edema Pulses- Radial, DP- 2+        Assessment & Plan:      Problem List Items Addressed This Visit    Osteoporosis   Relevant Orders   Vitamin D, 25-hydroxy (Completed)   Essential hypertension, benign - Primary    Controlled no changes to HCTZ       Relevant Orders   Comprehensive metabolic panel (Completed)   CBC with Differential/Platelet (Completed)   Lipid panel (Completed)   Arthritis    Known OA she has been playing more recently, recommend topical NSAIDS or        Other Visit Diagnoses    Fatigue, unspecified type       check labs, likley MTF   Relevant Orders   TSH (Completed)   Vitamin D, 25-hydroxy (Completed)   Vitamin B12 (Completed)   Scalp lesion       ? contact dermatitis, vs psoriasis odd only 1 spot,given topical steroid, if not clearing dermatology referral   Tinnitus of right ear          Note: This dictation was prepared with Dragon dictation along with smaller phrase technology. Any transcriptional errors that result from this process are unintentional.

## 2016-01-29 LAB — VITAMIN D 25 HYDROXY (VIT D DEFICIENCY, FRACTURES): Vit D, 25-Hydroxy: 32 ng/mL (ref 30–100)

## 2016-01-30 NOTE — Assessment & Plan Note (Signed)
Controlled no changes to HCTZ

## 2016-01-30 NOTE — Assessment & Plan Note (Signed)
Known OA she has been playing more recently, recommend topical NSAIDS or

## 2016-02-01 ENCOUNTER — Other Ambulatory Visit: Payer: Self-pay | Admitting: Family Medicine

## 2016-02-09 ENCOUNTER — Ambulatory Visit: Payer: BLUE CROSS/BLUE SHIELD | Admitting: Family Medicine

## 2016-03-27 ENCOUNTER — Other Ambulatory Visit: Payer: Self-pay | Admitting: *Deleted

## 2016-03-27 MED ORDER — HYDROCHLOROTHIAZIDE 25 MG PO TABS: ORAL_TABLET | ORAL | 3 refills | Status: DC

## 2016-03-27 MED ORDER — ALENDRONATE SODIUM 70 MG PO TABS: ORAL_TABLET | ORAL | 3 refills | Status: DC

## 2016-08-01 ENCOUNTER — Encounter: Payer: BLUE CROSS/BLUE SHIELD | Admitting: Family Medicine

## 2016-10-30 ENCOUNTER — Other Ambulatory Visit: Payer: Self-pay

## 2016-10-30 MED ORDER — ALENDRONATE SODIUM 70 MG PO TABS: ORAL_TABLET | ORAL | 3 refills | Status: DC

## 2016-10-30 NOTE — Telephone Encounter (Signed)
Refill approrpriate 

## 2017-01-22 IMAGING — CT CT ANGIO CHEST
1 of 6 series · 5 of 36 positions shown · IV contrast (Omnipaque 300)
Comparison: Chest x-ray of 09/30/2012

CLINICAL DATA: Positive D-dimer, syncopal episode 2 weeks ago, some
chest discomfort

EXAM:
CT ANGIOGRAPHY CHEST WITH CONTRAST
TECHNIQUE: Multidetector CT imaging of the chest was performed using the
standard protocol during bolus administration of intravenous
contrast. Multiplanar CT image reconstructions and MIPs were
obtained to evaluate the vascular anatomy.
CONTRAST:  100mL OMNIPAQUE IOHEXOL 350 MG/ML SOLN

[Series 4: pe 3.0 b40f · axial · 0.59mm/px · z∈[-280,-112]mm · 5 of 85 slices shown]
[im 15/85  lung]
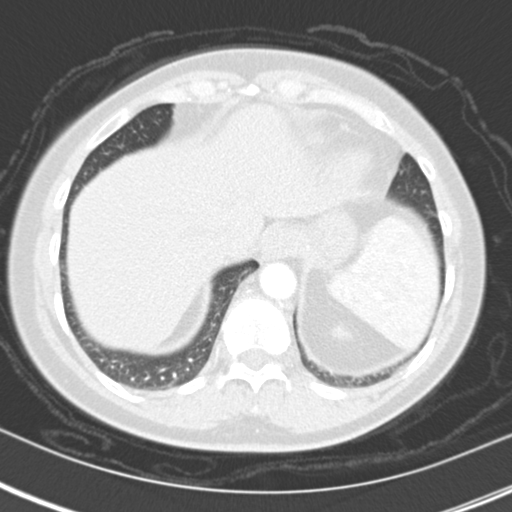
[im 29/85  mediastinal]
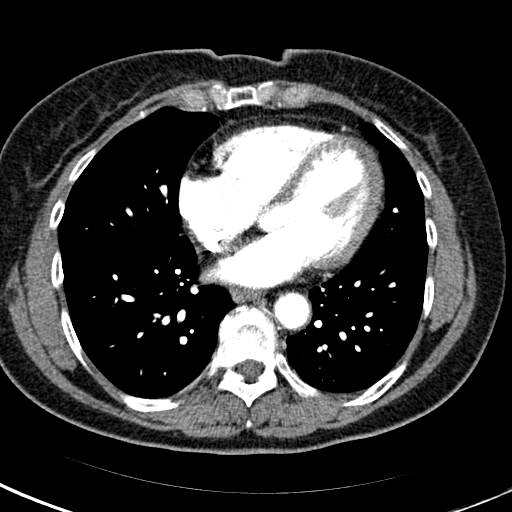
[im 43/85  lung]
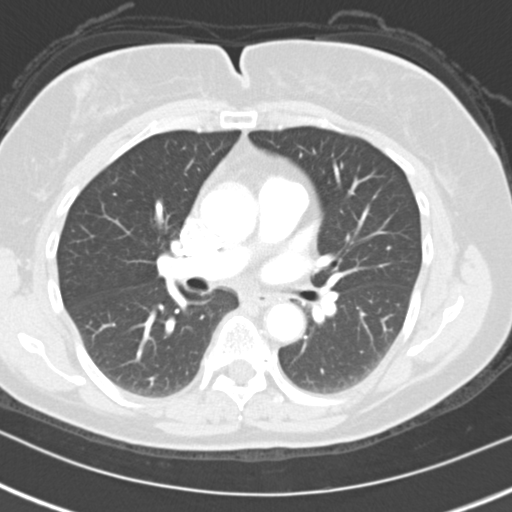
[im 57/85  mediastinal]
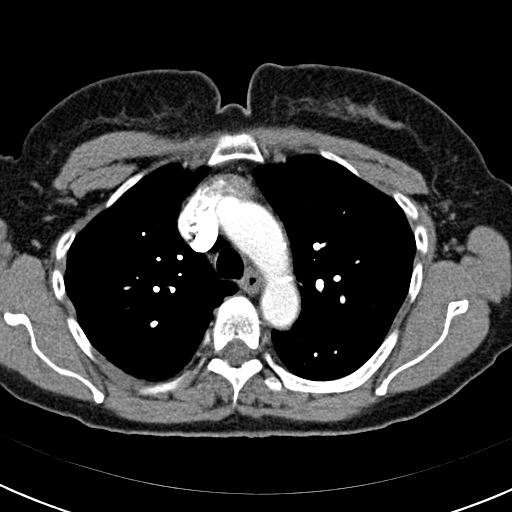
[im 71/85  lung]
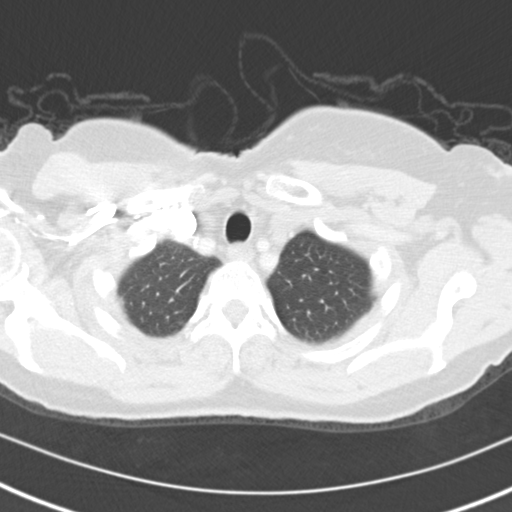

[5 of 36 positions shown; findings below may reference images not displayed]

FINDINGS: The pulmonary arteries are well opacified. There is no evidence of
pulmonary embolism. The thoracic aorta is not as well opacified but
no acute abnormality is seen. The origins of the great vessels are
patent. No mediastinal or hilar adenopathy is seen. The heart is
minimally prominent but no pericardial effusion is seen. No
abnormality is noted within the upper abdomen. The thyroid gland
appear symmetrical and normal.

On lung window images, no lung parenchymal abnormality is seen.
There is no suspicious pulmonary nodule noted. No effusion is seen.
Minimal right apical scarring is noted. The central airway is
patent. A single bleb is noted subpleural in location within the
lateral left lower lobe which appears thin walled and is of doubtful
significance. There are mild degenerative changes in the mid and
lower thoracic spine.

Review of the MIP images confirms the above findings.
IMPRESSION: 1. No evidence of pulmonary embolism.
2. No parenchymal infiltrate, effusion, or nodule.  No adenopathy.

## 2017-03-01 DIAGNOSIS — R0982 Postnasal drip: Secondary | ICD-10-CM | POA: Diagnosis not present

## 2017-03-01 DIAGNOSIS — J069 Acute upper respiratory infection, unspecified: Secondary | ICD-10-CM | POA: Diagnosis not present

## 2017-03-01 DIAGNOSIS — I10 Essential (primary) hypertension: Secondary | ICD-10-CM | POA: Diagnosis not present

## 2017-03-27 ENCOUNTER — Encounter: Payer: Self-pay | Admitting: Family Medicine

## 2017-03-27 ENCOUNTER — Ambulatory Visit (INDEPENDENT_AMBULATORY_CARE_PROVIDER_SITE_OTHER): Payer: Medicare Other | Admitting: Family Medicine

## 2017-03-27 ENCOUNTER — Other Ambulatory Visit: Payer: Self-pay

## 2017-03-27 VITALS — BP 124/62 | HR 88 | Temp 98.2°F | Resp 14 | Ht 63.0 in | Wt 142.0 lb

## 2017-03-27 DIAGNOSIS — Z1239 Encounter for other screening for malignant neoplasm of breast: Secondary | ICD-10-CM

## 2017-03-27 DIAGNOSIS — M810 Age-related osteoporosis without current pathological fracture: Secondary | ICD-10-CM | POA: Diagnosis not present

## 2017-03-27 DIAGNOSIS — I10 Essential (primary) hypertension: Secondary | ICD-10-CM | POA: Diagnosis not present

## 2017-03-27 DIAGNOSIS — H0263 Xanthelasma of right eye, unspecified eyelid: Secondary | ICD-10-CM | POA: Diagnosis not present

## 2017-03-27 DIAGNOSIS — H0266 Xanthelasma of left eye, unspecified eyelid: Secondary | ICD-10-CM

## 2017-03-27 DIAGNOSIS — E782 Mixed hyperlipidemia: Secondary | ICD-10-CM | POA: Diagnosis not present

## 2017-03-27 DIAGNOSIS — Z1231 Encounter for screening mammogram for malignant neoplasm of breast: Secondary | ICD-10-CM

## 2017-03-27 DIAGNOSIS — R5383 Other fatigue: Secondary | ICD-10-CM

## 2017-03-27 DIAGNOSIS — Z1321 Encounter for screening for nutritional disorder: Secondary | ICD-10-CM | POA: Diagnosis not present

## 2017-03-27 DIAGNOSIS — Z0000 Encounter for general adult medical examination without abnormal findings: Secondary | ICD-10-CM

## 2017-03-27 DIAGNOSIS — Z23 Encounter for immunization: Secondary | ICD-10-CM | POA: Diagnosis not present

## 2017-03-27 MED ORDER — ALENDRONATE SODIUM 70 MG PO TABS: ORAL_TABLET | ORAL | 3 refills | Status: DC

## 2017-03-27 MED ORDER — DICYCLOMINE HCL 10 MG PO CAPS: 10.0000 mg | ORAL_CAPSULE | Freq: Three times a day (TID) | ORAL | 1 refills | Status: DC | PRN

## 2017-03-27 MED ORDER — HYDROCHLOROTHIAZIDE 25 MG PO TABS
ORAL_TABLET | ORAL | 3 refills | Status: DC
Start: 2017-03-27 — End: 2018-01-23

## 2017-03-27 MED ORDER — DEXLANSOPRAZOLE 60 MG PO CPDR: 60.0000 mg | DELAYED_RELEASE_CAPSULE | Freq: Every day | ORAL | 3 refills | Status: DC

## 2017-03-27 MED ORDER — ZOSTER VAC RECOMB ADJUVANTED 50 MCG/0.5ML IM SUSR: 0.5000 mL | Freq: Once | INTRAMUSCULAR | 0 refills | Status: AC

## 2017-03-27 NOTE — Assessment & Plan Note (Signed)
Well controlled continue HCTZ

## 2017-03-27 NOTE — Assessment & Plan Note (Signed)
She has cholesterol deposites on eyelids, check lipids Referral to dermatology

## 2017-03-27 NOTE — Assessment & Plan Note (Signed)
Repeat bone density on calcium/vitamin D  Fosamax

## 2017-03-27 NOTE — Progress Notes (Signed)
Subjective:    Patient ID: Lindsay Mccormick, female    DOB: 15-May-1950, 67 y.o.   MRN: [id number]  Patient presents for CPE (is fasting)   Pt here for CPE Has had a few visits at urgent care for allergies/bronchitis , no major problems    Mother passed last weekend, she was 72, had CHF. She has support from all of her family and her children. She is sleeping okay but has had chronic fatigue. Works and goes home and sits and watches TV. Does not do her hobbies anymore doesn't have the energy to do so.     Would like to see dermatology for the spots on her eyelids, seems to have more    Medications reviewed  HTN- taking HCTZ as prescribed   Screening Tests / Date Colonoscopy     UTD                Zostavax - Due   Mammogram  Due Influenza Vaccine  Bone Density- Due- osteoporosis  Pneumonia- Due for prevnar 13  Tetanus/tdap- Due   Review Of Systems:  GEN- denies fatigue, fever, weight loss,weakness, recent illness HEENT- denies eye drainage, change in vision, nasal discharge, CVS- denies chest pain, palpitations RESP- denies SOB, cough, wheeze ABD- denies N/V, change in stools, abd pain GU- denies dysuria, hematuria, dribbling, incontinence MSK- denies joint pain, muscle aches, injury Neuro- denies headache, dizziness, syncope, seizure activity       Objective:    BP 124/62   Pulse 88   Temp 98.2 F (36.8 C) (Oral)   Resp 14   Ht 5\' 3"  (1.6 m)   Wt 142 lb (64.4 kg)   SpO2 99%   BMI 25.15 kg/m  GEN- NAD, alert and oriented x3 HEENT- PERRL, EOMI, non injected sclera, pink conjunctiva, MMM, oropharynx clear Neck- Supple, no thyromegaly, no bruit  CVS- RRR, no murmur RESP-CTAB ABD-NABS,soft,NT,ND Skin- Right upper lid- seed size black head, multiple xantheaslema inner creases bilat eyelid  Psych- normal affect and mood no SI , PhQ 9 score 9  EXT- No edema Pulses- Radial, DP- 2+        Assessment & Plan:      Problem List Items Addressed This Visit       Unprioritized   Osteoporosis    Repeat bone density on calcium/vitamin D  Fosamax      Relevant Medications   alendronate (FOSAMAX) 70 MG tablet   Other Relevant Orders   Vitamin D, 25-hydroxy   DG Bone Density   Hyperlipidemia    She has cholesterol deposites on eyelids, check lipids Referral to dermatology      Relevant Medications   hydrochlorothiazide (HYDRODIURIL) 25 MG tablet   Other Relevant Orders   Lipid panel   Essential hypertension, benign    Well controlled continue HCTZ      Relevant Medications   hydrochlorothiazide (HYDRODIURIL) 25 MG tablet   Other Relevant Orders   TSH    Other Visit Diagnoses    Routine general medical examination at a health care facility    -  Primary   CPE done, fasting labs, Prevnar 13 given, shingles vaccine sent to pharmacy. Discussed getting back into her activites, reading, poetry which she enjoyed, exerc   Relevant Orders   CBC with Differential/Platelet   Comprehensive metabolic panel   Lipid panel   Other fatigue       Relevant Orders   TSH   Vitamin B12   Xanthelasma of eyelid, bilateral  Breast cancer screening       Relevant Orders   MM SCREENING BREAST TOMO BILATERAL   Need for shingles vaccine       Relevant Medications   Zoster Vaccine Adjuvanted [facility]) injection   Need for prophylactic vaccination against Streptococcus pneumoniae (pneumococcus)       Relevant Orders   Pneumococcal conjugate vaccine 13-valent IM (Completed)      Note: This dictation was prepared with Dragon dictation along with smaller phrase technology. Any transcriptional errors that result from this process are unintentional.

## 2017-03-27 NOTE — Patient Instructions (Addendum)
Schedule your Mammogram and Bone Density - [place]  Referral to dermatology  Prevnar 13 given Shingles vaccine sent to pharmacy  We will call with lab results  F/U 6 months

## 2017-03-28 LAB — VITAMIN B12: Vitamin B-12: 533 pg/mL (ref 200–1100)

## 2017-03-28 LAB — TSH: TSH: 0.65 m[IU]/L (ref 0.40–4.50)

## 2017-03-28 LAB — COMPREHENSIVE METABOLIC PANEL
AG Ratio: 1.8 (calc) (ref 1.0–2.5)
ALBUMIN MSPROF: 4.5 g/dL (ref 3.6–5.1)
ALKALINE PHOSPHATASE (APISO): 41 U/L (ref 33–130)
ALT: 10 U/L (ref 6–29)
AST: 11 U/L (ref 10–35)
BILIRUBIN TOTAL: 0.3 mg/dL (ref 0.2–1.2)
BUN: 7 mg/dL (ref 7–25)
CO2: 30 mmol/L (ref 20–32)
CREATININE: 0.73 mg/dL (ref 0.50–0.99)
Calcium: 9.8 mg/dL (ref 8.6–10.4)
Chloride: 105 mmol/L (ref 98–110)
GLOBULIN: 2.5 g/dL (ref 1.9–3.7)
Glucose, Bld: 97 mg/dL (ref 65–99)
Potassium: 4 mmol/L (ref 3.5–5.3)
Sodium: 144 mmol/L (ref 135–146)
TOTAL PROTEIN: 7 g/dL (ref 6.1–8.1)

## 2017-03-28 LAB — CBC WITH DIFFERENTIAL/PLATELET
BASOS PCT: 1 %
Basophils Absolute: 40 cells/uL (ref 0–200)
EOS ABS: 80 {cells}/uL (ref 15–500)
Eosinophils Relative: 2 %
HCT: 37.7 % (ref 35.0–45.0)
HEMOGLOBIN: 12.6 g/dL (ref 11.7–15.5)
Lymphs Abs: 2220 cells/uL (ref 850–3900)
MCH: 29.8 pg (ref 27.0–33.0)
MCHC: 33.4 g/dL (ref 32.0–36.0)
MCV: 89.1 fL (ref 80.0–100.0)
MONOS PCT: 6.8 %
MPV: 9.5 fL (ref 7.5–12.5)
NEUTROS ABS: 1388 {cells}/uL — AB (ref 1500–7800)
Neutrophils Relative %: 34.7 %
PLATELETS: 290 10*3/uL (ref 140–400)
RBC: 4.23 10*6/uL (ref 3.80–5.10)
RDW: 12.5 % (ref 11.0–15.0)
TOTAL LYMPHOCYTE: 55.5 %
WBC mixed population: 272 cells/uL (ref 200–950)
WBC: 4 10*3/uL (ref 3.8–10.8)

## 2017-03-28 LAB — LIPID PANEL
CHOL/HDL RATIO: 3.1 (calc) (ref <5.0)
Cholesterol: 221 mg/dL — ABNORMAL HIGH (ref <200)
HDL: 71 mg/dL (ref >50)
LDL Cholesterol (Calc): 122 mg/dL (calc) — ABNORMAL HIGH
NON-HDL CHOLESTEROL (CALC): 150 mg/dL — AB (ref <130)
TRIGLYCERIDES: 162 mg/dL — AB (ref <150)

## 2017-03-28 LAB — VITAMIN D 25 HYDROXY (VIT D DEFICIENCY, FRACTURES): Vit D, 25-Hydroxy: 18 ng/mL — ABNORMAL LOW (ref 30–100)

## 2017-03-29 ENCOUNTER — Other Ambulatory Visit: Payer: Self-pay | Admitting: *Deleted

## 2017-03-29 MED ORDER — VITAMIN D (ERGOCALCIFEROL) 1.25 MG (50000 UNIT) PO CAPS: 50000.0000 [IU] | ORAL_CAPSULE | ORAL | 0 refills | Status: DC

## 2017-03-29 MED ORDER — PRAVASTATIN SODIUM 20 MG PO TABS: 20.0000 mg | ORAL_TABLET | Freq: Every day | ORAL | 3 refills | Status: DC

## 2017-04-05 DIAGNOSIS — L708 Other acne: Secondary | ICD-10-CM | POA: Diagnosis not present

## 2017-04-27 ENCOUNTER — Ambulatory Visit: Payer: BLUE CROSS/BLUE SHIELD

## 2017-04-27 ENCOUNTER — Other Ambulatory Visit: Payer: BLUE CROSS/BLUE SHIELD

## 2017-04-30 ENCOUNTER — Other Ambulatory Visit: Payer: Self-pay | Admitting: *Deleted

## 2017-04-30 MED ORDER — DICYCLOMINE HCL 10 MG PO CAPS: 10.0000 mg | ORAL_CAPSULE | Freq: Three times a day (TID) | ORAL | 1 refills | Status: DC | PRN

## 2017-05-01 DIAGNOSIS — R921 Mammographic calcification found on diagnostic imaging of breast: Secondary | ICD-10-CM | POA: Diagnosis not present

## 2017-05-01 DIAGNOSIS — M810 Age-related osteoporosis without current pathological fracture: Secondary | ICD-10-CM | POA: Diagnosis not present

## 2017-05-01 DIAGNOSIS — N6489 Other specified disorders of breast: Secondary | ICD-10-CM | POA: Diagnosis not present

## 2017-05-01 DIAGNOSIS — Z780 Asymptomatic menopausal state: Secondary | ICD-10-CM | POA: Diagnosis not present

## 2017-05-01 DIAGNOSIS — Z1231 Encounter for screening mammogram for malignant neoplasm of breast: Secondary | ICD-10-CM | POA: Diagnosis not present

## 2017-05-01 DIAGNOSIS — M8589 Other specified disorders of bone density and structure, multiple sites: Secondary | ICD-10-CM | POA: Diagnosis not present

## 2017-05-01 LAB — HM MAMMOGRAPHY

## 2017-05-01 LAB — HM DEXA SCAN

## 2017-05-22 ENCOUNTER — Encounter: Payer: Self-pay | Admitting: Family Medicine

## 2017-05-24 ENCOUNTER — Encounter: Payer: Self-pay | Admitting: *Deleted

## 2017-05-28 ENCOUNTER — Encounter: Payer: Self-pay | Admitting: *Deleted

## 2017-06-17 ENCOUNTER — Other Ambulatory Visit: Payer: Self-pay | Admitting: Family Medicine

## 2017-06-18 MED ORDER — VITAMIN D3 25 MCG (1000 UT) PO CAPS: 1.0000 | ORAL_CAPSULE | Freq: Every day | ORAL | 3 refills | Status: AC

## 2017-10-18 ENCOUNTER — Other Ambulatory Visit: Payer: Self-pay | Admitting: Family Medicine

## 2018-01-22 ENCOUNTER — Other Ambulatory Visit: Payer: Self-pay

## 2018-01-22 ENCOUNTER — Encounter: Payer: Self-pay | Admitting: Family Medicine

## 2018-01-22 ENCOUNTER — Ambulatory Visit (INDEPENDENT_AMBULATORY_CARE_PROVIDER_SITE_OTHER): Payer: Medicare Other | Admitting: Family Medicine

## 2018-01-22 VITALS — BP 122/66 | HR 62 | Temp 98.1°F | Resp 14 | Ht 63.0 in | Wt 144.0 lb

## 2018-01-22 DIAGNOSIS — E559 Vitamin D deficiency, unspecified: Secondary | ICD-10-CM

## 2018-01-22 DIAGNOSIS — J302 Other seasonal allergic rhinitis: Secondary | ICD-10-CM

## 2018-01-22 DIAGNOSIS — E782 Mixed hyperlipidemia: Secondary | ICD-10-CM

## 2018-01-22 DIAGNOSIS — G44209 Tension-type headache, unspecified, not intractable: Secondary | ICD-10-CM | POA: Diagnosis not present

## 2018-01-22 DIAGNOSIS — I10 Essential (primary) hypertension: Secondary | ICD-10-CM

## 2018-01-22 DIAGNOSIS — F4381 Prolonged grief disorder: Secondary | ICD-10-CM

## 2018-01-22 DIAGNOSIS — D539 Nutritional anemia, unspecified: Secondary | ICD-10-CM | POA: Diagnosis not present

## 2018-01-22 DIAGNOSIS — R5383 Other fatigue: Secondary | ICD-10-CM

## 2018-01-22 DIAGNOSIS — F4329 Adjustment disorder with other symptoms: Secondary | ICD-10-CM

## 2018-01-22 NOTE — Progress Notes (Signed)
Subjective:    Patient ID: Lindsay Mccormick, female    DOB: Oct 12, 1950, 67 y.o.   MRN: [id number]  Patient presents for Follow-up (is not fasting)   Pt here to f/u chronic medical problems     Has been having headaches, gets tension headaches past couple of weeks, using flonase   Often feels all the energy draining out and hasspells where she has had 2 spells where she she feels like she is fading out ( one was at the hospital and another on a trip to [place] years ago), last for a few minutes then feels normal again  No change in appetite, no change in sleep pattern  She has been under a lot of stress since mother passed and now it is the holidays which makes things worse No seizure activity     HTN- taking BP meds HCTZ as prescribed, no change   Hyperlipidemia-  She is concerned the cholesterol medicine makes her fatigued and has side effects   Vitamin D def needs to be rechecked  Review Of Systems:  GEN- + fatigue, fever, weight loss,weakness, recent illness HEENT- denies eye drainage, change in vision, nasal discharge, CVS- denies chest pain, palpitations RESP- denies SOB, cough, wheeze ABD- denies N/V, change in stools, abd pain GU- denies dysuria, hematuria, dribbling, incontinence MSK- denies joint pain, muscle aches, injury Neuro- denies headache, dizziness, syncope, seizure activity       Objective:    BP 122/66   Pulse 62   Temp 98.1 F (36.7 C) (Oral)   Resp 14   Ht 5\' 3"  (1.6 m)   Wt 144 lb (65.3 kg)   SpO2 98%   BMI 25.51 kg/m  GEN- NAD, alert and oriented x3 HEENT- PERRL, EOMI, non injected sclera, pink conjunctiva, MMM, oropharynx clear Neck- Supple, no thyromegaly CVS- RRR, no murmur RESP-CTAB ABD-NABS,soft,NT,ND Psych- normal affect and mood Neuro-CNII-XII in tact, no focal deficits , pHQ 9 sore  8 EXT- No edema Pulses- Radial, DP- 2+        Assessment & Plan:      Problem List Items Addressed This Visit      Unprioritized   Essential hypertension, benign    Well controlled no changes to meds       Relevant Medications   hydrochlorothiazide (HYDRODIURIL) 25 MG tablet   Other Relevant Orders   CBC with Differential/Platelet (Completed)   Comprehensive metabolic panel (Completed)   Hyperlipidemia - Primary    Discussed taking the statin drug 3 times a week consistently she agrees to do so.      Relevant Medications   hydrochlorothiazide (HYDRODIURIL) 25 MG tablet   Other Relevant Orders   Lipid panel (Completed)   Seasonal allergies    Other Visit Diagnoses    Acute non intractable tension-type headache       Normal neuro exam, relieved b y OTC meds, high stress currently, hold on imaging    Vitamin D deficiency       Relevant Orders   Vitamin D, 25-hydroxy (Completed)   Fatigue, unspecified type       Mostly 2/2 mood, but will check metabolic panels as well, no red flags   Relevant Orders   TSH (Completed)   Vitamin B12 (Completed)   Prolonged grief reaction       I think this is the source of most of her symptoms, she declines meds, she has sisters that support her      Note: This dictation was prepared with Viviann Spare  dictation along with smaller phrase technology. Any transcriptional errors that result from this process are unintentional.

## 2018-01-22 NOTE — Patient Instructions (Addendum)
Take cholesterol three times a week  Take tylenol for headache Take your allergy medications F/U 6 months for Physical

## 2018-01-23 ENCOUNTER — Encounter: Payer: Self-pay | Admitting: Family Medicine

## 2018-01-23 LAB — LIPID PANEL
CHOL/HDL RATIO: 4 (calc) (ref <5.0)
Cholesterol: 252 mg/dL — ABNORMAL HIGH (ref <200)
HDL: 63 mg/dL (ref >50)
LDL CHOLESTEROL (CALC): 161 mg/dL — AB
Non-HDL Cholesterol (Calc): 189 mg/dL (calc) — ABNORMAL HIGH (ref <130)
TRIGLYCERIDES: 153 mg/dL — AB (ref <150)

## 2018-01-23 LAB — COMPREHENSIVE METABOLIC PANEL
AG RATIO: 1.9 (calc) (ref 1.0–2.5)
ALT: 11 U/L (ref 6–29)
AST: 13 U/L (ref 10–35)
Albumin: 4.6 g/dL (ref 3.6–5.1)
Alkaline phosphatase (APISO): 38 U/L (ref 33–130)
BUN: 8 mg/dL (ref 7–25)
CO2: 30 mmol/L (ref 20–32)
Calcium: 10 mg/dL (ref 8.6–10.4)
Chloride: 103 mmol/L (ref 98–110)
Creat: 0.73 mg/dL (ref 0.50–0.99)
GLOBULIN: 2.4 g/dL (ref 1.9–3.7)
GLUCOSE: 92 mg/dL (ref 65–99)
Potassium: 3.7 mmol/L (ref 3.5–5.3)
SODIUM: 142 mmol/L (ref 135–146)
TOTAL PROTEIN: 7 g/dL (ref 6.1–8.1)
Total Bilirubin: 0.4 mg/dL (ref 0.2–1.2)

## 2018-01-23 LAB — CBC WITH DIFFERENTIAL/PLATELET
BASOS PCT: 0.6 %
Basophils Absolute: 30 cells/uL (ref 0–200)
EOS PCT: 1.8 %
Eosinophils Absolute: 90 cells/uL (ref 15–500)
HCT: 36.4 % (ref 35.0–45.0)
HEMOGLOBIN: 12.6 g/dL (ref 11.7–15.5)
Lymphs Abs: 2340 cells/uL (ref 850–3900)
MCH: 30.6 pg (ref 27.0–33.0)
MCHC: 34.6 g/dL (ref 32.0–36.0)
MCV: 88.3 fL (ref 80.0–100.0)
MONOS PCT: 7 %
MPV: 9.4 fL (ref 7.5–12.5)
NEUTROS ABS: 2190 {cells}/uL (ref 1500–7800)
Neutrophils Relative %: 43.8 %
Platelets: 309 10*3/uL (ref 140–400)
RBC: 4.12 10*6/uL (ref 3.80–5.10)
RDW: 13 % (ref 11.0–15.0)
Total Lymphocyte: 46.8 %
WBC mixed population: 350 cells/uL (ref 200–950)
WBC: 5 10*3/uL (ref 3.8–10.8)

## 2018-01-23 LAB — TSH: TSH: 0.8 mIU/L (ref 0.40–4.50)

## 2018-01-23 LAB — VITAMIN D 25 HYDROXY (VIT D DEFICIENCY, FRACTURES): Vit D, 25-Hydroxy: 33 ng/mL (ref 30–100)

## 2018-01-23 LAB — VITAMIN B12: VITAMIN B 12: 452 pg/mL (ref 200–1100)

## 2018-01-23 MED ORDER — DICYCLOMINE HCL 10 MG PO CAPS: 10.0000 mg | ORAL_CAPSULE | Freq: Three times a day (TID) | ORAL | 1 refills | Status: DC | PRN

## 2018-01-23 MED ORDER — DEXLANSOPRAZOLE 60 MG PO CPDR: 60.0000 mg | DELAYED_RELEASE_CAPSULE | Freq: Every day | ORAL | 3 refills | Status: DC

## 2018-01-23 MED ORDER — ALENDRONATE SODIUM 70 MG PO TABS: ORAL_TABLET | ORAL | 3 refills | Status: DC

## 2018-01-23 MED ORDER — HYDROCHLOROTHIAZIDE 25 MG PO TABS: ORAL_TABLET | ORAL | 3 refills | Status: DC

## 2018-01-23 NOTE — Assessment & Plan Note (Signed)
Well controlled no changes to meds 

## 2018-01-23 NOTE — Assessment & Plan Note (Signed)
Discussed taking the statin drug 3 times a week consistently she agrees to do so.

## 2018-01-24 ENCOUNTER — Telehealth: Payer: Self-pay | Admitting: *Deleted

## 2018-01-24 MED ORDER — DEXLANSOPRAZOLE 60 MG PO CPDR: 60.0000 mg | DELAYED_RELEASE_CAPSULE | Freq: Every day | ORAL | 3 refills | Status: DC

## 2018-01-24 NOTE — Telephone Encounter (Signed)
Received request from pharmacy for [place] on [address].   PA submitted.   Dx: K21.0- GERD.   Received immediate determination.   PA Case [id number] Approved

## 2018-01-28 DIAGNOSIS — Z1283 Encounter for screening for malignant neoplasm of skin: Secondary | ICD-10-CM | POA: Diagnosis not present

## 2018-01-28 DIAGNOSIS — L723 Sebaceous cyst: Secondary | ICD-10-CM | POA: Diagnosis not present

## 2018-01-28 DIAGNOSIS — L821 Other seborrheic keratosis: Secondary | ICD-10-CM | POA: Diagnosis not present

## 2018-01-28 DIAGNOSIS — D235 Other benign neoplasm of skin of trunk: Secondary | ICD-10-CM | POA: Diagnosis not present

## 2018-01-28 DIAGNOSIS — D234 Other benign neoplasm of skin of scalp and neck: Secondary | ICD-10-CM | POA: Diagnosis not present

## 2018-01-28 DIAGNOSIS — D2322 Other benign neoplasm of skin of left ear and external auricular canal: Secondary | ICD-10-CM | POA: Diagnosis not present

## 2018-01-28 DIAGNOSIS — D23111 Other benign neoplasm of skin of right upper eyelid, including canthus: Secondary | ICD-10-CM | POA: Diagnosis not present

## 2018-01-28 DIAGNOSIS — D230 Other benign neoplasm of skin of lip: Secondary | ICD-10-CM | POA: Diagnosis not present

## 2018-01-28 DIAGNOSIS — D2339 Other benign neoplasm of skin of other parts of face: Secondary | ICD-10-CM | POA: Diagnosis not present

## 2018-01-28 DIAGNOSIS — Z719 Counseling, unspecified: Secondary | ICD-10-CM | POA: Diagnosis not present

## 2018-01-28 DIAGNOSIS — D2321 Other benign neoplasm of skin of right ear and external auricular canal: Secondary | ICD-10-CM | POA: Diagnosis not present

## 2018-01-28 DIAGNOSIS — D23121 Other benign neoplasm of skin of left upper eyelid, including canthus: Secondary | ICD-10-CM | POA: Diagnosis not present

## 2018-02-28 DIAGNOSIS — D23121 Other benign neoplasm of skin of left upper eyelid, including canthus: Secondary | ICD-10-CM | POA: Diagnosis not present

## 2018-02-28 DIAGNOSIS — D230 Other benign neoplasm of skin of lip: Secondary | ICD-10-CM | POA: Diagnosis not present

## 2018-02-28 DIAGNOSIS — D2339 Other benign neoplasm of skin of other parts of face: Secondary | ICD-10-CM | POA: Diagnosis not present

## 2018-02-28 DIAGNOSIS — D23111 Other benign neoplasm of skin of right upper eyelid, including canthus: Secondary | ICD-10-CM | POA: Diagnosis not present

## 2018-02-28 DIAGNOSIS — Z1283 Encounter for screening for malignant neoplasm of skin: Secondary | ICD-10-CM | POA: Diagnosis not present

## 2018-02-28 DIAGNOSIS — D2361 Other benign neoplasm of skin of right upper limb, including shoulder: Secondary | ICD-10-CM | POA: Diagnosis not present

## 2018-02-28 DIAGNOSIS — L723 Sebaceous cyst: Secondary | ICD-10-CM | POA: Diagnosis not present

## 2018-02-28 DIAGNOSIS — D2321 Other benign neoplasm of skin of right ear and external auricular canal: Secondary | ICD-10-CM | POA: Diagnosis not present

## 2018-02-28 DIAGNOSIS — D2322 Other benign neoplasm of skin of left ear and external auricular canal: Secondary | ICD-10-CM | POA: Diagnosis not present

## 2018-02-28 DIAGNOSIS — Z719 Counseling, unspecified: Secondary | ICD-10-CM | POA: Diagnosis not present

## 2018-02-28 DIAGNOSIS — D234 Other benign neoplasm of skin of scalp and neck: Secondary | ICD-10-CM | POA: Diagnosis not present

## 2018-02-28 DIAGNOSIS — D2362 Other benign neoplasm of skin of left upper limb, including shoulder: Secondary | ICD-10-CM | POA: Diagnosis not present

## 2018-03-21 DIAGNOSIS — Z719 Counseling, unspecified: Secondary | ICD-10-CM | POA: Diagnosis not present

## 2018-03-21 DIAGNOSIS — D2321 Other benign neoplasm of skin of right ear and external auricular canal: Secondary | ICD-10-CM | POA: Diagnosis not present

## 2018-03-21 DIAGNOSIS — D2361 Other benign neoplasm of skin of right upper limb, including shoulder: Secondary | ICD-10-CM | POA: Diagnosis not present

## 2018-03-21 DIAGNOSIS — L821 Other seborrheic keratosis: Secondary | ICD-10-CM | POA: Diagnosis not present

## 2018-03-21 DIAGNOSIS — D230 Other benign neoplasm of skin of lip: Secondary | ICD-10-CM | POA: Diagnosis not present

## 2018-03-21 DIAGNOSIS — D23121 Other benign neoplasm of skin of left upper eyelid, including canthus: Secondary | ICD-10-CM | POA: Diagnosis not present

## 2018-03-21 DIAGNOSIS — D2322 Other benign neoplasm of skin of left ear and external auricular canal: Secondary | ICD-10-CM | POA: Diagnosis not present

## 2018-03-21 DIAGNOSIS — H0260 Xanthelasma of unspecified eye, unspecified eyelid: Secondary | ICD-10-CM | POA: Diagnosis not present

## 2018-03-21 DIAGNOSIS — D234 Other benign neoplasm of skin of scalp and neck: Secondary | ICD-10-CM | POA: Diagnosis not present

## 2018-03-21 DIAGNOSIS — Z1283 Encounter for screening for malignant neoplasm of skin: Secondary | ICD-10-CM | POA: Diagnosis not present

## 2018-03-21 DIAGNOSIS — D23111 Other benign neoplasm of skin of right upper eyelid, including canthus: Secondary | ICD-10-CM | POA: Diagnosis not present

## 2018-03-21 DIAGNOSIS — D2339 Other benign neoplasm of skin of other parts of face: Secondary | ICD-10-CM | POA: Diagnosis not present

## 2018-03-30 ENCOUNTER — Other Ambulatory Visit: Payer: Self-pay | Admitting: Family Medicine

## 2018-04-08 DIAGNOSIS — R110 Nausea: Secondary | ICD-10-CM | POA: Diagnosis not present

## 2018-04-08 DIAGNOSIS — J019 Acute sinusitis, unspecified: Secondary | ICD-10-CM | POA: Diagnosis not present

## 2018-04-15 DIAGNOSIS — D2339 Other benign neoplasm of skin of other parts of face: Secondary | ICD-10-CM | POA: Diagnosis not present

## 2018-04-15 DIAGNOSIS — Z719 Counseling, unspecified: Secondary | ICD-10-CM | POA: Diagnosis not present

## 2018-04-15 DIAGNOSIS — D23121 Other benign neoplasm of skin of left upper eyelid, including canthus: Secondary | ICD-10-CM | POA: Diagnosis not present

## 2018-04-15 DIAGNOSIS — D230 Other benign neoplasm of skin of lip: Secondary | ICD-10-CM | POA: Diagnosis not present

## 2018-04-15 DIAGNOSIS — D2362 Other benign neoplasm of skin of left upper limb, including shoulder: Secondary | ICD-10-CM | POA: Diagnosis not present

## 2018-04-15 DIAGNOSIS — D2361 Other benign neoplasm of skin of right upper limb, including shoulder: Secondary | ICD-10-CM | POA: Diagnosis not present

## 2018-04-15 DIAGNOSIS — H0260 Xanthelasma of unspecified eye, unspecified eyelid: Secondary | ICD-10-CM | POA: Diagnosis not present

## 2018-04-15 DIAGNOSIS — D2322 Other benign neoplasm of skin of left ear and external auricular canal: Secondary | ICD-10-CM | POA: Diagnosis not present

## 2018-04-15 DIAGNOSIS — D234 Other benign neoplasm of skin of scalp and neck: Secondary | ICD-10-CM | POA: Diagnosis not present

## 2018-04-15 DIAGNOSIS — D23111 Other benign neoplasm of skin of right upper eyelid, including canthus: Secondary | ICD-10-CM | POA: Diagnosis not present

## 2018-04-15 DIAGNOSIS — D2321 Other benign neoplasm of skin of right ear and external auricular canal: Secondary | ICD-10-CM | POA: Diagnosis not present

## 2018-04-15 DIAGNOSIS — Z1283 Encounter for screening for malignant neoplasm of skin: Secondary | ICD-10-CM | POA: Diagnosis not present

## 2018-12-11 DIAGNOSIS — Z719 Counseling, unspecified: Secondary | ICD-10-CM | POA: Diagnosis not present

## 2018-12-11 DIAGNOSIS — D2361 Other benign neoplasm of skin of right upper limb, including shoulder: Secondary | ICD-10-CM | POA: Diagnosis not present

## 2018-12-11 DIAGNOSIS — D2339 Other benign neoplasm of skin of other parts of face: Secondary | ICD-10-CM | POA: Diagnosis not present

## 2018-12-11 DIAGNOSIS — D2321 Other benign neoplasm of skin of right ear and external auricular canal: Secondary | ICD-10-CM | POA: Diagnosis not present

## 2018-12-11 DIAGNOSIS — C44729 Squamous cell carcinoma of skin of left lower limb, including hip: Secondary | ICD-10-CM | POA: Diagnosis not present

## 2018-12-11 DIAGNOSIS — D230 Other benign neoplasm of skin of lip: Secondary | ICD-10-CM | POA: Diagnosis not present

## 2018-12-11 DIAGNOSIS — D2362 Other benign neoplasm of skin of left upper limb, including shoulder: Secondary | ICD-10-CM | POA: Diagnosis not present

## 2018-12-11 DIAGNOSIS — D485 Neoplasm of uncertain behavior of skin: Secondary | ICD-10-CM | POA: Diagnosis not present

## 2018-12-11 DIAGNOSIS — D234 Other benign neoplasm of skin of scalp and neck: Secondary | ICD-10-CM | POA: Diagnosis not present

## 2018-12-11 DIAGNOSIS — Z1283 Encounter for screening for malignant neoplasm of skin: Secondary | ICD-10-CM | POA: Diagnosis not present

## 2018-12-11 DIAGNOSIS — D2322 Other benign neoplasm of skin of left ear and external auricular canal: Secondary | ICD-10-CM | POA: Diagnosis not present

## 2018-12-11 DIAGNOSIS — D23121 Other benign neoplasm of skin of left upper eyelid, including canthus: Secondary | ICD-10-CM | POA: Diagnosis not present

## 2018-12-11 DIAGNOSIS — D23111 Other benign neoplasm of skin of right upper eyelid, including canthus: Secondary | ICD-10-CM | POA: Diagnosis not present

## 2019-01-01 DIAGNOSIS — D2322 Other benign neoplasm of skin of left ear and external auricular canal: Secondary | ICD-10-CM | POA: Diagnosis not present

## 2019-01-01 DIAGNOSIS — D2321 Other benign neoplasm of skin of right ear and external auricular canal: Secondary | ICD-10-CM | POA: Diagnosis not present

## 2019-01-01 DIAGNOSIS — D2339 Other benign neoplasm of skin of other parts of face: Secondary | ICD-10-CM | POA: Diagnosis not present

## 2019-01-01 DIAGNOSIS — L570 Actinic keratosis: Secondary | ICD-10-CM | POA: Diagnosis not present

## 2019-01-01 DIAGNOSIS — D234 Other benign neoplasm of skin of scalp and neck: Secondary | ICD-10-CM | POA: Diagnosis not present

## 2019-01-01 DIAGNOSIS — D230 Other benign neoplasm of skin of lip: Secondary | ICD-10-CM | POA: Diagnosis not present

## 2019-01-01 DIAGNOSIS — C44729 Squamous cell carcinoma of skin of left lower limb, including hip: Secondary | ICD-10-CM | POA: Diagnosis not present

## 2019-01-01 DIAGNOSIS — Z719 Counseling, unspecified: Secondary | ICD-10-CM | POA: Diagnosis not present

## 2019-01-01 DIAGNOSIS — Z1283 Encounter for screening for malignant neoplasm of skin: Secondary | ICD-10-CM | POA: Diagnosis not present

## 2019-01-01 DIAGNOSIS — D23111 Other benign neoplasm of skin of right upper eyelid, including canthus: Secondary | ICD-10-CM | POA: Diagnosis not present

## 2019-01-01 DIAGNOSIS — D2361 Other benign neoplasm of skin of right upper limb, including shoulder: Secondary | ICD-10-CM | POA: Diagnosis not present

## 2019-01-01 DIAGNOSIS — D23121 Other benign neoplasm of skin of left upper eyelid, including canthus: Secondary | ICD-10-CM | POA: Diagnosis not present

## 2019-01-01 DIAGNOSIS — Z85828 Personal history of other malignant neoplasm of skin: Secondary | ICD-10-CM | POA: Diagnosis not present

## 2019-02-03 DIAGNOSIS — D2322 Other benign neoplasm of skin of left ear and external auricular canal: Secondary | ICD-10-CM | POA: Diagnosis not present

## 2019-02-03 DIAGNOSIS — D23111 Other benign neoplasm of skin of right upper eyelid, including canthus: Secondary | ICD-10-CM | POA: Diagnosis not present

## 2019-02-03 DIAGNOSIS — D230 Other benign neoplasm of skin of lip: Secondary | ICD-10-CM | POA: Diagnosis not present

## 2019-02-03 DIAGNOSIS — Z1283 Encounter for screening for malignant neoplasm of skin: Secondary | ICD-10-CM | POA: Diagnosis not present

## 2019-02-03 DIAGNOSIS — D2362 Other benign neoplasm of skin of left upper limb, including shoulder: Secondary | ICD-10-CM | POA: Diagnosis not present

## 2019-02-03 DIAGNOSIS — D2339 Other benign neoplasm of skin of other parts of face: Secondary | ICD-10-CM | POA: Diagnosis not present

## 2019-02-03 DIAGNOSIS — Z719 Counseling, unspecified: Secondary | ICD-10-CM | POA: Diagnosis not present

## 2019-02-03 DIAGNOSIS — D2361 Other benign neoplasm of skin of right upper limb, including shoulder: Secondary | ICD-10-CM | POA: Diagnosis not present

## 2019-02-03 DIAGNOSIS — D23121 Other benign neoplasm of skin of left upper eyelid, including canthus: Secondary | ICD-10-CM | POA: Diagnosis not present

## 2019-02-03 DIAGNOSIS — D2321 Other benign neoplasm of skin of right ear and external auricular canal: Secondary | ICD-10-CM | POA: Diagnosis not present

## 2019-02-03 DIAGNOSIS — Z85828 Personal history of other malignant neoplasm of skin: Secondary | ICD-10-CM | POA: Diagnosis not present

## 2019-02-03 DIAGNOSIS — D234 Other benign neoplasm of skin of scalp and neck: Secondary | ICD-10-CM | POA: Diagnosis not present

## 2019-03-11 ENCOUNTER — Encounter: Payer: Self-pay | Admitting: Family Medicine

## 2019-03-11 ENCOUNTER — Other Ambulatory Visit: Payer: Self-pay

## 2019-03-11 ENCOUNTER — Ambulatory Visit (INDEPENDENT_AMBULATORY_CARE_PROVIDER_SITE_OTHER): Payer: Medicare Other | Admitting: Family Medicine

## 2019-03-11 VITALS — BP 132/80 | HR 88 | Temp 98.0°F | Resp 14 | Ht 63.0 in | Wt 143.0 lb

## 2019-03-11 DIAGNOSIS — E782 Mixed hyperlipidemia: Secondary | ICD-10-CM

## 2019-03-11 DIAGNOSIS — Z23 Encounter for immunization: Secondary | ICD-10-CM

## 2019-03-11 DIAGNOSIS — K2100 Gastro-esophageal reflux disease with esophagitis, without bleeding: Secondary | ICD-10-CM | POA: Diagnosis not present

## 2019-03-11 DIAGNOSIS — Z0000 Encounter for general adult medical examination without abnormal findings: Secondary | ICD-10-CM

## 2019-03-11 DIAGNOSIS — Z0001 Encounter for general adult medical examination with abnormal findings: Secondary | ICD-10-CM

## 2019-03-11 DIAGNOSIS — E559 Vitamin D deficiency, unspecified: Secondary | ICD-10-CM

## 2019-03-11 DIAGNOSIS — I10 Essential (primary) hypertension: Secondary | ICD-10-CM | POA: Diagnosis not present

## 2019-03-11 DIAGNOSIS — M810 Age-related osteoporosis without current pathological fracture: Secondary | ICD-10-CM | POA: Diagnosis not present

## 2019-03-11 DIAGNOSIS — F5104 Psychophysiologic insomnia: Secondary | ICD-10-CM | POA: Diagnosis not present

## 2019-03-11 DIAGNOSIS — Z85828 Personal history of other malignant neoplasm of skin: Secondary | ICD-10-CM

## 2019-03-11 LAB — COMPREHENSIVE METABOLIC PANEL
AG Ratio: 1.7 (calc) (ref 1.0–2.5)
ALT: 12 U/L (ref 6–29)
AST: 14 U/L (ref 10–35)
Albumin: 4.5 g/dL (ref 3.6–5.1)
Alkaline phosphatase (APISO): 41 U/L (ref 37–153)
BUN: 10 mg/dL (ref 7–25)
CO2: 30 mmol/L (ref 20–32)
Calcium: 10.2 mg/dL (ref 8.6–10.4)
Chloride: 104 mmol/L (ref 98–110)
Creat: 0.83 mg/dL (ref 0.50–0.99)
Globulin: 2.7 g/dL (calc) (ref 1.9–3.7)
Glucose, Bld: 96 mg/dL (ref 65–99)
Potassium: 4.2 mmol/L (ref 3.5–5.3)
Sodium: 143 mmol/L (ref 135–146)
Total Bilirubin: 0.5 mg/dL (ref 0.2–1.2)
Total Protein: 7.2 g/dL (ref 6.1–8.1)

## 2019-03-11 LAB — CBC WITH DIFFERENTIAL/PLATELET
Absolute Monocytes: 412 cells/uL (ref 200–950)
Basophils Absolute: 41 cells/uL (ref 0–200)
Basophils Relative: 0.7 %
Eosinophils Absolute: 110 cells/uL (ref 15–500)
Eosinophils Relative: 1.9 %
HCT: 36.8 % (ref 35.0–45.0)
Hemoglobin: 12.4 g/dL (ref 11.7–15.5)
Lymphs Abs: 2958 cells/uL (ref 850–3900)
MCH: 30 pg (ref 27.0–33.0)
MCHC: 33.7 g/dL (ref 32.0–36.0)
MCV: 88.9 fL (ref 80.0–100.0)
MPV: 9.5 fL (ref 7.5–12.5)
Monocytes Relative: 7.1 %
Neutro Abs: 2279 cells/uL (ref 1500–7800)
Neutrophils Relative %: 39.3 %
Platelets: 285 10*3/uL (ref 140–400)
RBC: 4.14 10*6/uL (ref 3.80–5.10)
RDW: 12.6 % (ref 11.0–15.0)
Total Lymphocyte: 51 %
WBC: 5.8 10*3/uL (ref 3.8–10.8)

## 2019-03-11 LAB — VITAMIN D 25 HYDROXY (VIT D DEFICIENCY, FRACTURES): Vit D, 25-Hydroxy: 35 ng/mL (ref 30–100)

## 2019-03-11 LAB — LIPID PANEL
Cholesterol: 235 mg/dL — ABNORMAL HIGH (ref <200)
HDL: 59 mg/dL (ref >=50)
LDL Cholesterol (Calc): 151 mg/dL (calc) — ABNORMAL HIGH
Non-HDL Cholesterol (Calc): 176 mg/dL (calc) — ABNORMAL HIGH (ref <130)
Total CHOL/HDL Ratio: 4 (calc) (ref <5.0)
Triglycerides: 128 mg/dL (ref <150)

## 2019-03-11 LAB — TSH: TSH: 1.32 mIU/L (ref 0.40–4.50)

## 2019-03-11 MED ORDER — ALENDRONATE SODIUM 70 MG PO TABS: ORAL_TABLET | ORAL | 3 refills | Status: DC

## 2019-03-11 MED ORDER — HYDROCHLOROTHIAZIDE 25 MG PO TABS: ORAL_TABLET | ORAL | 3 refills | Status: DC

## 2019-03-11 MED ORDER — FLUTICASONE PROPIONATE 50 MCG/ACT NA SUSP: 1.0000 | Freq: Every day | NASAL | 2 refills | Status: DC

## 2019-03-11 MED ORDER — DEXILANT 60 MG PO CPDR: 60.0000 mg | DELAYED_RELEASE_CAPSULE | Freq: Every day | ORAL | 3 refills | Status: DC

## 2019-03-11 MED ORDER — ALBUTEROL SULFATE HFA 108 (90 BASE) MCG/ACT IN AERS: 2.0000 | INHALATION_SPRAY | Freq: Four times a day (QID) | RESPIRATORY_TRACT | 0 refills | Status: DC | PRN

## 2019-03-11 NOTE — Progress Notes (Signed)
Subjective:   Patient presents for Medicare Annual/Subsequent preventive examination.   Review Past Medical/Family/Social: Per EMR   Pt here for medicare wellness visit/ meds reviewed  Hyperlipidemia, taking pravastatin three times a week  HTN- taking HCTZ without difficulty   GERD/IBS- on dexilant , symptoms controlled   Osteoporosis- fosmax, calcium/vitamin D   Difficulty sleeping past few months, she is working at [employer] she enjoys.  But she has been under stress.  She is only getting about 4 hours of sleep.  She feels foggy during the daytime.  She also has intermittent spells where she feels a little dizzy or woozy.  She has not checked her blood pressure when this occurs.  She also admits that she skips meals as well.  Since it is just her she does not cook on a regular basis   Given an inhaler when she had bronchitis and sinusitis at the urgent care.  She is not had any further difficulty breathing but wants to keep the inhaler on hand  Risk Factors  Current exercise habits: walks some  Dietary issues discussed: no major issues   Cardiac risk factors: HTN, hyperlipidemia   Depression Screen  (Note: if answer to either of the following is "Yes", a more complete depression screening is indicated)  Over the past two weeks, have you felt down, depressed or hopeless? No Over the past two weeks, have you felt little interest or pleasure in doing things? No Have you lost interest or pleasure in daily life? No Do you often feel hopeless? No Do you cry easily over simple problems? No   Activities of Daily Living  In your present state of health, do you have any difficulty performing the following activities?:  Driving? No  Managing money? No  Feeding yourself? No  Getting from bed to chair? No  Climbing a flight of stairs? No  Preparing food and eating?: No  Bathing or showering? No  Getting dressed: No  Getting to the toilet? No  Using the toilet:No  Moving around  from place to place: No  In the past year have you fallen or had a near fall?:No  Are you sexually active? No  Do you have more than one partner? No   Hearing Difficulties: No  Do you often ask people to speak up or repeat themselves? No  Do you experience ringing or noises in your ears? No Do you have difficulty understanding soft or whispered voices? No  Do you feel that you have a problem with memory? No Do you often misplace items? No  Do you feel safe at home? Yes  Cognitive Testing  Alert? Yes Normal Appearance?Yes  Oriented to person? Yes Place? Yes  Time? Yes  Recall of three objects? Yes  Can perform simple calculations? Yes  Displays appropriate judgment?Yes  Can read the correct time from a watch face?Yes   List the Names of Other Physician/Practitioners you currently use:  [facility]   Screening Tests / Date Colonoscopy     UTD                Zostavax - Due   Mammogram  Due Influenza Vaccine  Bone Density- Due- osteoporosis  Pneumonia- Due for pneumovax 23 Tetanus/tdap- UTD   Review Of Systems:  GEN- denies fatigue, fever, weight loss,weakness, recent illness HEENT- denies eye drainage, change in vision, nasal discharge, CVS- denies chest pain, palpitations RESP- denies SOB, cough, wheeze ABD- denies N/V, change in stools, abd pain GU- denies dysuria,  hematuria, dribbling, incontinence MSK- denies joint pain, muscle aches, injury Neuro- denies headache, dizziness, syncope, seizure activity  Physical: vitals reviewed  GEN- NAD, alert and oriented x3 HEENT- PERRL, EOMI, non injected sclera, pink conjunctiva, MMM, oropharynx clear, TM clear bilat, no effusion  Neck- Supple, no thryomegaly CVS- RRR, no murmur RESP-CTAB ABD-NABS,soft,NT,ND  Psych- normal affect and mood EXT- No edema Pulses- Radial, DP- 2+   Assessment:    Annual wellness medicare exam   Plan:    During the course of the visit the patient was educated and  counseled about appropriate screening and preventive services including:  Fall/depression/audit C screen negative   HTN- well controlled, no changes   Hyperlipidemia- on statin drug TIW, recheck lipids, discussed diet avoid fried foods, limit processed foods, baked goods   GERD- controlled with PPI  Chronic insomnia-insomnia with some mental fogginess as well as stressors.  We will try her on melatonin to help with sleep.  Also advised that she check her blood pressure when she does have a spell where she feels wheezy to make sure she is not having hypotensive spells.  We also discussed eating on a regular basis not going many hours as this can also cause a drop in her blood sugar which will make her feel lightheaded/dizzy  Mammogram- Due- can schedule in March with bone density  Bone Density- Due in March   Immunizations-discussed immunizations.  She will proceed with Pneumovax 23 today.  She will get shingles vaccine at the pharmacy in the next couple months.  We also discussed COVID-19 vaccination but is not available in her state for her age at this time   Pt has advanced directives at home       Diet review for nutrition referral? Yes ____ Not Indicated __x__  Patient Instructions (the written plan) was given to the patient.  Medicare Attestation  I have personally reviewed:  The patient's medical and social history  Their use of alcohol, tobacco or illicit drugs  Their current medications and supplements  The patient's functional ability including ADLs,fall risks, home safety risks, cognitive, and hearing and visual impairment  Diet and physical activities  Evidence for depression or mood disorders  The patient's weight, height, BMI, and visual acuity have been recorded in the chart. I have made referrals, counseling, and provided education to the patient based on review of the above and I have provided the patient with a written personalized care plan for preventive services.

## 2019-03-11 NOTE — Addendum Note (Signed)
Addended by: Sheral Flow on: 03/11/2019 11:36 AM   Modules accepted: Orders

## 2019-03-11 NOTE — Patient Instructions (Addendum)
Try the melatonin for sleep  Pneumonia vaccine given  We will call lab results  F/U 6 months

## 2019-03-12 ENCOUNTER — Telehealth: Payer: Self-pay | Admitting: *Deleted

## 2019-03-12 MED ORDER — DEXILANT 60 MG PO CPDR: 60.0000 mg | DELAYED_RELEASE_CAPSULE | Freq: Every day | ORAL | 3 refills | Status: AC

## 2019-03-12 NOTE — Telephone Encounter (Signed)
Your information has been sent to [facility]. 

## 2019-03-12 NOTE — Telephone Encounter (Signed)
Received request from pharmacy for [place] on [address].  PA submitted.   Dx: K21.9- GERD.

## 2019-03-12 NOTE — Telephone Encounter (Signed)
Received PA determination.   [place] [id number] approved  03/12/2019- 03/11/2020.  Pharmacy made aware.

## 2019-03-13 ENCOUNTER — Encounter: Payer: Self-pay | Admitting: *Deleted

## 2019-03-29 DIAGNOSIS — Z23 Encounter for immunization: Secondary | ICD-10-CM | POA: Diagnosis not present

## 2019-04-08 ENCOUNTER — Other Ambulatory Visit: Payer: Self-pay | Admitting: Family Medicine

## 2019-04-26 DIAGNOSIS — Z23 Encounter for immunization: Secondary | ICD-10-CM | POA: Diagnosis not present

## 2019-06-04 ENCOUNTER — Telehealth: Payer: Self-pay | Admitting: *Deleted

## 2019-06-04 NOTE — Telephone Encounter (Signed)
Okay to order DX- OSteoporosis Screening mammo

## 2019-06-04 NOTE — Telephone Encounter (Signed)
Received call from patient.   Requested order for Mammogram and Dexa scan as last imaging noted in 2019.  Ok to order?

## 2019-06-04 NOTE — Telephone Encounter (Signed)
Order faxed to [facility] in [place]  Call placed to patient and patient made aware.

## 2019-06-05 DIAGNOSIS — D230 Other benign neoplasm of skin of lip: Secondary | ICD-10-CM | POA: Diagnosis not present

## 2019-06-05 DIAGNOSIS — D23121 Other benign neoplasm of skin of left upper eyelid, including canthus: Secondary | ICD-10-CM | POA: Diagnosis not present

## 2019-06-05 DIAGNOSIS — Z1283 Encounter for screening for malignant neoplasm of skin: Secondary | ICD-10-CM | POA: Diagnosis not present

## 2019-06-05 DIAGNOSIS — D2321 Other benign neoplasm of skin of right ear and external auricular canal: Secondary | ICD-10-CM | POA: Diagnosis not present

## 2019-06-05 DIAGNOSIS — D2322 Other benign neoplasm of skin of left ear and external auricular canal: Secondary | ICD-10-CM | POA: Diagnosis not present

## 2019-06-05 DIAGNOSIS — Z85828 Personal history of other malignant neoplasm of skin: Secondary | ICD-10-CM | POA: Diagnosis not present

## 2019-06-05 DIAGNOSIS — D234 Other benign neoplasm of skin of scalp and neck: Secondary | ICD-10-CM | POA: Diagnosis not present

## 2019-06-05 DIAGNOSIS — D2339 Other benign neoplasm of skin of other parts of face: Secondary | ICD-10-CM | POA: Diagnosis not present

## 2019-06-05 DIAGNOSIS — D23111 Other benign neoplasm of skin of right upper eyelid, including canthus: Secondary | ICD-10-CM | POA: Diagnosis not present

## 2019-06-05 DIAGNOSIS — D2362 Other benign neoplasm of skin of left upper limb, including shoulder: Secondary | ICD-10-CM | POA: Diagnosis not present

## 2019-06-05 DIAGNOSIS — Z719 Counseling, unspecified: Secondary | ICD-10-CM | POA: Diagnosis not present

## 2019-06-05 DIAGNOSIS — D2361 Other benign neoplasm of skin of right upper limb, including shoulder: Secondary | ICD-10-CM | POA: Diagnosis not present

## 2019-06-07 ENCOUNTER — Other Ambulatory Visit: Payer: Self-pay | Admitting: Family Medicine

## 2019-07-29 DIAGNOSIS — Z1231 Encounter for screening mammogram for malignant neoplasm of breast: Secondary | ICD-10-CM | POA: Diagnosis not present

## 2019-07-29 DIAGNOSIS — M8580 Other specified disorders of bone density and structure, unspecified site: Secondary | ICD-10-CM | POA: Diagnosis not present

## 2019-09-09 ENCOUNTER — Ambulatory Visit: Payer: Medicare Other | Admitting: Family Medicine

## 2019-09-24 ENCOUNTER — Ambulatory Visit (INDEPENDENT_AMBULATORY_CARE_PROVIDER_SITE_OTHER): Payer: Medicare Other | Admitting: Family Medicine

## 2019-09-24 ENCOUNTER — Other Ambulatory Visit: Payer: Self-pay

## 2019-09-24 ENCOUNTER — Encounter: Payer: Self-pay | Admitting: Family Medicine

## 2019-09-24 VITALS — BP 130/68 | HR 86 | Temp 97.8°F | Resp 16 | Ht 63.0 in | Wt 144.0 lb

## 2019-09-24 DIAGNOSIS — E559 Vitamin D deficiency, unspecified: Secondary | ICD-10-CM

## 2019-09-24 DIAGNOSIS — E782 Mixed hyperlipidemia: Secondary | ICD-10-CM

## 2019-09-24 DIAGNOSIS — I10 Essential (primary) hypertension: Secondary | ICD-10-CM

## 2019-09-24 DIAGNOSIS — M810 Age-related osteoporosis without current pathological fracture: Secondary | ICD-10-CM | POA: Diagnosis not present

## 2019-09-24 DIAGNOSIS — K2100 Gastro-esophageal reflux disease with esophagitis, without bleeding: Secondary | ICD-10-CM | POA: Diagnosis not present

## 2019-09-24 NOTE — Assessment & Plan Note (Signed)
Able to tolerate statin drugs.  We will discontinue.  We will recheck her cholesterol.  She has been able to maintain her levels then we will stay off of any additional medication.  If her LDL continues to increase we will try Zetia

## 2019-09-24 NOTE — Patient Instructions (Signed)
F/U 6 months for Physical  

## 2019-09-24 NOTE — Assessment & Plan Note (Signed)
Continue Fosamax as well as calcium and vitamin D.  I will obtain her last bone density to review.  We also discussed adding weight lifting to her current exercise routine.  She can do this twice a week

## 2019-09-24 NOTE — Assessment & Plan Note (Signed)
Blood pressure is controlled no change in medication.  We will check renal function today.

## 2019-09-24 NOTE — Progress Notes (Signed)
   Subjective:    Patient ID: Lindsay Mccormick, female    DOB: [birth year], 69 y.o.   MRN: [id number]  Patient presents for Follow-up (is fasting)   Pt here to f/u chronic medical problems  medications reviewed   HTN-taking her blood pressure medicine as prescribed no side effects.  hYPERLIPIDEMia - taking  Three times a week but still havve leg pains, so she stopped taking a few months ago, and tried to control with diet  cut out fats, red meat     Chronic insomnia-sleep has improved.  She does use melatonin as needed.    Mammogram was normal, she has not heard from Bone density , she is taking fosamax vitamin D/[place], walking a few days a week  No new concerns    Review Of Systems:  GEN- denies fatigue, fever, weight loss,weakness, recent illness HEENT- denies eye drainage, change in vision, nasal discharge, CVS- denies chest pain, palpitations RESP- denies SOB, cough, wheeze ABD- denies N/V, change in stools, abd pain GU- denies dysuria, hematuria, dribbling, incontinence MSK- denies joint pain, muscle aches, injury Neuro- denies headache, dizziness, syncope, seizure activity       Objective:    BP 130/68   Pulse 86   Temp 97.8 F (36.6 C) (Temporal)   Resp 16   Ht 5\' 3"  (1.6 m)   Wt 144 lb (65.3 kg)   SpO2 98%   BMI 25.51 kg/m  GEN- NAD, alert and oriented x3 HEENT- PERRL, EOMI, non injected sclera, pink conjunctiva, MMM, oropharynx clear Neck- Supple, no thyromegaly CVS- RRR, no murmur RESP-CTAB ABD-NABS,soft,NT,ND EXT- No edema Pulses- Radial, DP- 2+        Assessment & Plan:      Problem List Items Addressed This Visit      Unprioritized   Essential hypertension, benign - Primary    Blood pressure is controlled no change in medication.  We will check renal function today.      Relevant Orders   CBC with Differential/Platelet   Comprehensive metabolic panel   GERD (gastroesophageal reflux disease)    History of esophagitis, continue  dexilant Symptoms controlled       Hyperlipidemia    Able to tolerate statin drugs.  We will discontinue.  We will recheck her cholesterol.  She has been able to maintain her levels then we will stay off of any additional medication.  If her LDL continues to increase we will try Zetia      Relevant Orders   Lipid panel   Osteoporosis    Continue Fosamax as well as calcium and vitamin D.  I will obtain her last bone density to review.  We also discussed adding weight lifting to her current exercise routine.  She can do this twice a week      Vitamin D deficiency      Note: This dictation was prepared with Dragon dictation along with smaller phrase technology. Any transcriptional errors that result from this process are unintentional.

## 2019-09-24 NOTE — Assessment & Plan Note (Signed)
History of esophagitis, continue dexilant Symptoms controlled

## 2019-09-25 LAB — CBC WITH DIFFERENTIAL/PLATELET
Absolute Monocytes: 371 cells/uL (ref 200–950)
Basophils Absolute: 42 cells/uL (ref 0–200)
Basophils Relative: 0.9 %
Eosinophils Absolute: 141 cells/uL (ref 15–500)
Eosinophils Relative: 3 %
HCT: 37.4 % (ref 35.0–45.0)
Hemoglobin: 12.5 g/dL (ref 11.7–15.5)
Lymphs Abs: 2378 cells/uL (ref 850–3900)
MCH: 30 pg (ref 27.0–33.0)
MCHC: 33.4 g/dL (ref 32.0–36.0)
MCV: 89.9 fL (ref 80.0–100.0)
MPV: 9.8 fL (ref 7.5–12.5)
Monocytes Relative: 7.9 %
Neutro Abs: 1767 cells/uL (ref 1500–7800)
Neutrophils Relative %: 37.6 %
Platelets: 293 10*3/uL (ref 140–400)
RBC: 4.16 10*6/uL (ref 3.80–5.10)
RDW: 12.6 % (ref 11.0–15.0)
Total Lymphocyte: 50.6 %
WBC: 4.7 10*3/uL (ref 3.8–10.8)

## 2019-09-25 LAB — COMPREHENSIVE METABOLIC PANEL
AG Ratio: 1.8 (calc) (ref 1.0–2.5)
ALT: 9 U/L (ref 6–29)
AST: 12 U/L (ref 10–35)
Albumin: 4.6 g/dL (ref 3.6–5.1)
Alkaline phosphatase (APISO): 37 U/L (ref 37–153)
BUN: 8 mg/dL (ref 7–25)
CO2: 30 mmol/L (ref 20–32)
Calcium: 10 mg/dL (ref 8.6–10.4)
Chloride: 101 mmol/L (ref 98–110)
Creat: 0.73 mg/dL (ref 0.50–0.99)
Globulin: 2.6 g/dL (calc) (ref 1.9–3.7)
Glucose, Bld: 96 mg/dL (ref 65–99)
Potassium: 3.6 mmol/L (ref 3.5–5.3)
Sodium: 142 mmol/L (ref 135–146)
Total Bilirubin: 0.3 mg/dL (ref 0.2–1.2)
Total Protein: 7.2 g/dL (ref 6.1–8.1)

## 2019-09-25 LAB — LIPID PANEL
Cholesterol: 199 mg/dL (ref <200)
HDL: 56 mg/dL (ref >=50)
LDL Cholesterol (Calc): 113 mg/dL (calc) — ABNORMAL HIGH
Non-HDL Cholesterol (Calc): 143 mg/dL (calc) — ABNORMAL HIGH (ref <130)
Total CHOL/HDL Ratio: 3.6 (calc) (ref <5.0)
Triglycerides: 188 mg/dL — ABNORMAL HIGH (ref <150)

## 2019-11-13 DIAGNOSIS — D234 Other benign neoplasm of skin of scalp and neck: Secondary | ICD-10-CM | POA: Diagnosis not present

## 2019-11-13 DIAGNOSIS — Z719 Counseling, unspecified: Secondary | ICD-10-CM | POA: Diagnosis not present

## 2019-11-13 DIAGNOSIS — D23121 Other benign neoplasm of skin of left upper eyelid, including canthus: Secondary | ICD-10-CM | POA: Diagnosis not present

## 2019-11-13 DIAGNOSIS — Z1283 Encounter for screening for malignant neoplasm of skin: Secondary | ICD-10-CM | POA: Diagnosis not present

## 2019-11-13 DIAGNOSIS — D23111 Other benign neoplasm of skin of right upper eyelid, including canthus: Secondary | ICD-10-CM | POA: Diagnosis not present

## 2019-11-13 DIAGNOSIS — D230 Other benign neoplasm of skin of lip: Secondary | ICD-10-CM | POA: Diagnosis not present

## 2019-11-13 DIAGNOSIS — D2322 Other benign neoplasm of skin of left ear and external auricular canal: Secondary | ICD-10-CM | POA: Diagnosis not present

## 2019-11-13 DIAGNOSIS — D2321 Other benign neoplasm of skin of right ear and external auricular canal: Secondary | ICD-10-CM | POA: Diagnosis not present

## 2019-11-13 DIAGNOSIS — D2361 Other benign neoplasm of skin of right upper limb, including shoulder: Secondary | ICD-10-CM | POA: Diagnosis not present

## 2019-11-13 DIAGNOSIS — D2339 Other benign neoplasm of skin of other parts of face: Secondary | ICD-10-CM | POA: Diagnosis not present

## 2019-11-13 DIAGNOSIS — D2362 Other benign neoplasm of skin of left upper limb, including shoulder: Secondary | ICD-10-CM | POA: Diagnosis not present

## 2019-11-13 DIAGNOSIS — Z85828 Personal history of other malignant neoplasm of skin: Secondary | ICD-10-CM | POA: Diagnosis not present

## 2020-01-28 DIAGNOSIS — Z23 Encounter for immunization: Secondary | ICD-10-CM | POA: Diagnosis not present

## 2020-02-03 ENCOUNTER — Other Ambulatory Visit: Payer: Self-pay | Admitting: Family Medicine

## 2020-03-29 ENCOUNTER — Ambulatory Visit: Payer: Medicare Other | Admitting: Family Medicine

## 2020-04-06 ENCOUNTER — Other Ambulatory Visit: Payer: Self-pay

## 2020-04-06 ENCOUNTER — Ambulatory Visit (INDEPENDENT_AMBULATORY_CARE_PROVIDER_SITE_OTHER): Payer: Medicare Other | Admitting: Family Medicine

## 2020-04-06 ENCOUNTER — Encounter: Payer: Self-pay | Admitting: Family Medicine

## 2020-04-06 VITALS — BP 134/86 | HR 92 | Temp 97.6°F | Ht 63.0 in | Wt 144.6 lb

## 2020-04-06 DIAGNOSIS — I10 Essential (primary) hypertension: Secondary | ICD-10-CM | POA: Diagnosis not present

## 2020-04-06 DIAGNOSIS — M810 Age-related osteoporosis without current pathological fracture: Secondary | ICD-10-CM | POA: Diagnosis not present

## 2020-04-06 DIAGNOSIS — F5104 Psychophysiologic insomnia: Secondary | ICD-10-CM

## 2020-04-06 DIAGNOSIS — E782 Mixed hyperlipidemia: Secondary | ICD-10-CM

## 2020-04-06 DIAGNOSIS — K2100 Gastro-esophageal reflux disease with esophagitis, without bleeding: Secondary | ICD-10-CM | POA: Diagnosis not present

## 2020-04-06 DIAGNOSIS — E559 Vitamin D deficiency, unspecified: Secondary | ICD-10-CM

## 2020-04-06 MED ORDER — ALBUTEROL SULFATE HFA 108 (90 BASE) MCG/ACT IN AERS: INHALATION_SPRAY | RESPIRATORY_TRACT | 1 refills | Status: AC

## 2020-04-06 NOTE — Progress Notes (Signed)
   Subjective:    Patient ID: Lindsay Mccormick, female    DOB: [birth year], 71 y.o.   MRN: [id number]  Patient presents for Follow-up (Htn, vitd D, patient is fasting. Having no medical concerns today. Taking meds as prescribed)  Pt here to f/u HTN  No meds taken   She is following with dermatology  regulary   Osteoporois on calcium and vitamin D and foasmax weekly  Bone density done in 2021  Seasonal allergies has flonase   GERD- taking dexilant daily for reflux     Review Of Systems:  GEN- denies fatigue, fever, weight loss,weakness, recent illness HEENT- denies eye drainage, change in vision, nasal discharge, CVS- denies chest pain, palpitations RESP- denies SOB, cough, wheeze ABD- denies N/V, change in stools, abd pain GU- denies dysuria, hematuria, dribbling, incontinence MSK- denies joint pain, muscle aches, injury Neuro- denies headache, dizziness, syncope, seizure activity       Objective:    BP 134/86   Pulse 92   Temp 97.6 F (36.4 C)   Ht 5\' 3"  (1.6 m)   Wt 144 lb 9.6 oz (65.6 kg)   SpO2 96%   BMI 25.61 kg/m  GEN- NAD, alert and oriented x3 HEENT- PERRL, EOMI, non injected sclera, pink conjunctiva, MMM, oropharynx clear Neck- Supple, no thyromegaly CVS- RRR, no murmur RESP-CTAB ABD-NABS,soft,NT,ND EXT- No edema Pulses- Radial, DP- 2+        Assessment & Plan:      Problem List Items Addressed This Visit      Unprioritized   Chronic insomnia    Overall doing well has not relied on melatonin the past couple months.      Essential hypertension, benign - Primary    Blood pressure controlled no change in medications.  Renal function checked today.      Relevant Orders   CBC with Differential/Platelet (Completed)   Comprehensive metabolic panel (Completed)   GERD (gastroesophageal reflux disease)    Continues to benefit from [place].      Hyperlipidemia   Relevant Orders   Lipid panel (Completed)   Osteoporosis    Continue Fosamax  weekly along with calcium vitamin D weightbearing exercise.  Repeat bone density in 2 years.      Vitamin D deficiency   Relevant Orders   Vitamin D, 25-hydroxy (Completed)      Note: This dictation was prepared with Dragon dictation along with smaller phrase technology. Any transcriptional errors that result from this process are unintentional.

## 2020-04-07 ENCOUNTER — Encounter: Payer: Self-pay | Admitting: Family Medicine

## 2020-04-07 LAB — CBC WITH DIFFERENTIAL/PLATELET
Absolute Monocytes: 443 cells/uL (ref 200–950)
Basophils Absolute: 30 cells/uL (ref 0–200)
Basophils Relative: 0.5 %
Eosinophils Absolute: 59 cells/uL (ref 15–500)
Eosinophils Relative: 1 %
HCT: 37.8 % (ref 35.0–45.0)
Hemoglobin: 13 g/dL (ref 11.7–15.5)
Lymphs Abs: 2714 cells/uL (ref 850–3900)
MCH: 30.6 pg (ref 27.0–33.0)
MCHC: 34.4 g/dL (ref 32.0–36.0)
MCV: 88.9 fL (ref 80.0–100.0)
MPV: 9.8 fL (ref 7.5–12.5)
Monocytes Relative: 7.5 %
Neutro Abs: 2655 cells/uL (ref 1500–7800)
Neutrophils Relative %: 45 %
Platelets: 300 10*3/uL (ref 140–400)
RBC: 4.25 10*6/uL (ref 3.80–5.10)
RDW: 12.5 % (ref 11.0–15.0)
Total Lymphocyte: 46 %
WBC: 5.9 10*3/uL (ref 3.8–10.8)

## 2020-04-07 LAB — VITAMIN D 25 HYDROXY (VIT D DEFICIENCY, FRACTURES): Vit D, 25-Hydroxy: 31 ng/mL (ref 30–100)

## 2020-04-07 LAB — COMPREHENSIVE METABOLIC PANEL
AG Ratio: 1.8 (calc) (ref 1.0–2.5)
ALT: 13 U/L (ref 6–29)
AST: 15 U/L (ref 10–35)
Albumin: 4.8 g/dL (ref 3.6–5.1)
Alkaline phosphatase (APISO): 43 U/L (ref 37–153)
BUN: 11 mg/dL (ref 7–25)
CO2: 29 mmol/L (ref 20–32)
Calcium: 10 mg/dL (ref 8.6–10.4)
Chloride: 102 mmol/L (ref 98–110)
Creat: 0.69 mg/dL (ref 0.50–0.99)
Globulin: 2.6 g/dL (calc) (ref 1.9–3.7)
Glucose, Bld: 88 mg/dL (ref 65–99)
Potassium: 4 mmol/L (ref 3.5–5.3)
Sodium: 142 mmol/L (ref 135–146)
Total Bilirubin: 0.5 mg/dL (ref 0.2–1.2)
Total Protein: 7.4 g/dL (ref 6.1–8.1)

## 2020-04-07 LAB — LIPID PANEL
Cholesterol: 245 mg/dL — ABNORMAL HIGH (ref <200)
HDL: 59 mg/dL (ref >=50)
LDL Cholesterol (Calc): 151 mg/dL (calc) — ABNORMAL HIGH
Non-HDL Cholesterol (Calc): 186 mg/dL (calc) — ABNORMAL HIGH (ref <130)
Total CHOL/HDL Ratio: 4.2 (calc) (ref <5.0)
Triglycerides: 208 mg/dL — ABNORMAL HIGH (ref <150)

## 2020-04-07 NOTE — Assessment & Plan Note (Signed)
Continue Fosamax weekly along with calcium vitamin D weightbearing exercise.  Repeat bone density in 2 years.

## 2020-04-07 NOTE — Assessment & Plan Note (Signed)
Overall doing well has not relied on melatonin the past couple months.

## 2020-04-07 NOTE — Assessment & Plan Note (Signed)
Continues to benefit from [place].

## 2020-04-07 NOTE — Assessment & Plan Note (Signed)
Blood pressure controlled no change in medications.  Renal function checked today.

## 2020-04-12 ENCOUNTER — Other Ambulatory Visit: Payer: Self-pay | Admitting: *Deleted

## 2020-04-12 MED ORDER — EZETIMIBE 10 MG PO TABS: 10.0000 mg | ORAL_TABLET | Freq: Every day | ORAL | 2 refills | Status: DC

## 2020-04-22 ENCOUNTER — Telehealth: Payer: Self-pay | Admitting: *Deleted

## 2020-04-22 MED ORDER — EZETIMIBE 10 MG PO TABS: 10.0000 mg | ORAL_TABLET | Freq: Every day | ORAL | 2 refills | Status: AC

## 2020-04-22 NOTE — Telephone Encounter (Signed)
Received request from pharmacy for [place] on [place].   PA submitted.   Dx: E78.5- HLD.   Received immediate determination.   [place] [id number] approved 04/22/2020- 04/22/2021.

## 2020-05-28 NOTE — Progress Notes (Addendum)
Subjective:   Lindsay Mccormick is a 70 y.o. female who presents for Medicare Annual (Subsequent) preventive examination.  Virtual Visit via Video Note  I connected with Lindsay Mccormick by a video enabled telemedicine application and verified that I am speaking with the correct person using two identifiers.  Location: Patient: Home Provider: Office Persons participating in the virtual visit: patient, provider   I discussed the limitations of evaluation and management by telemedicine and the availability of in person appointments. The patient expressed understanding and agreed to proceed.     [place]     Review of Systems    N/A  Cardiac Risk Factors include: advanced age (>28men, >56 women)     Objective:    Today's Vitals   There is no height or weight on file to calculate BMI.  Advanced Directives 05/31/2020 03/11/2019 06/23/2013 09/30/2012  Does Patient Have a Medical Advance Directive? Yes Yes Patient has advance directive, copy not in chart Patient does not have advance directive;Patient would not like information  Type of Scientist, forensic Power of [place];Living will Living will;Healthcare Power of [place];Living will -  Does patient want to make changes to medical advance directive? - No - Patient declined - -  Copy of [place] in Chart? No - copy requested No - copy requested - -  Pre-existing out of facility DNR order (yellow form or pink MOST form) - - - No    Current Medications (verified) Outpatient Encounter Medications as of 05/31/2020  Medication Sig  . acetaminophen (TYLENOL) 500 MG tablet Take 500 mg by mouth every 6 (six) hours as needed for pain.  Marland Kitchen albuterol (VENTOLIN HFA) 108 (90 Base) MCG/ACT inhaler TAKE 2 PUFFS BY MOUTH EVERY 6 HOURS AS NEEDED FOR WHEEZE OR SHORTNESS OF BREATH  . alendronate (FOSAMAX) 70 MG tablet TAKE 1 TABLET ONCE A WEEK WITH WATER ON AN EMPTY STOMACH  .  Cholecalciferol (VITAMIN D3) 1000 units CAPS Take 1 capsule (1,000 Units total) by mouth daily.  Marland Kitchen dexlansoprazole (DEXILANT) 60 MG capsule Take 1 capsule (60 mg total) by mouth daily.  Marland Kitchen ezetimibe (ZETIA) 10 MG tablet Take 1 tablet (10 mg total) by mouth daily.  . fluticasone (FLONASE) 50 MCG/ACT nasal spray PLACE 1 SPRAY INTO BOTH NOSTRILS DAILY.  . hydrochlorothiazide (HYDRODIURIL) 25 MG tablet TAKE 1 TABLET (25 MG TOTAL) BY MOUTH DAILY.  . Multiple Vitamin (MULTIVITAMIN WITH MINERALS) TABS tablet Take 1 tablet by mouth daily.  Marland Kitchen triamcinolone cream (KENALOG) 0.1 % Apply 1 application topically 2 (two) times daily. Apply to affected area in scalp  . melatonin 3 MG TABS tablet Take 3 mg by mouth at bedtime as needed. (Patient not taking: Reported on 05/31/2020)   No facility-administered encounter medications on file as of 05/31/2020.    Allergies (verified) Codeine and Statins   History: Past Medical History:  Diagnosis Date  . Allergy    takes OTC med nightly and Benadryl nightly  . Arthritis   . Cancer [facility])    Skin cancer  . Cardiac murmur    Benign  . Diarrhea    takes Imodium daily  . GERD (gastroesophageal reflux disease)    takes Zantac daily  . Headache(784.0)    rarely  . Heart murmur    Phreesia 05/31/2020  . Hemorrhoids    Internal;uses Proctofoam bid  . Hyperlipidemia    borderline but no meds at present  . Hypertension    takes HCTZ daily  .  Joint pain   . PONV (postoperative nausea and vomiting)    Past Surgical History:  Procedure Laterality Date  . ABDOMINAL HYSTERECTOMY    . APPENDECTOMY    . CESAREAN SECTION     x 2  . CHOLECYSTECTOMY     at least 10 years ago   . COLONOSCOPY  2008   Dr. Audie Box in [place]  . COLONOSCOPY N/A 06/23/2013   Dr. Gala Romney: internal hemorrhoids, colonic diverticulosis, negative colonic biopsies  . DIAGNOSTIC LAPAROSCOPY    . DILATION AND CURETTAGE OF UTERUS    . ESOPHAGOGASTRODUODENOSCOPY N/A 06/23/2013   Dr.  Gala Romney: mild erosive reflux esophagitis, gastric biopsy with chronic inflammation  . jaw biopsy    . TOOTH EXTRACTION N/A 10/07/2012   Procedure: REMOVAL OF INFLAMMED DENTIGEROUS CYST/IMPACTED #32;  Surgeon: Isac Caddy, DDS;  Location: [place];  Service: Oral Surgery;  Laterality: N/A;   Family History  Problem Relation Age of Onset  . Heart disease Mother   . Cancer Mother        breast  . Diabetes Mother   . Hypertension Father   . Heart disease Father   . Cancer Father        prostate  . Colon cancer Neg Hx        however husband passed away from colon cancer 10 years ago   Social History   Socioeconomic History  . Marital status: Widowed    Spouse name: Not on file  . Number of children: Not on file  . Years of education: Not on file  . Highest education level: Not on file  Occupational History  . Occupation: OFFICE Programme researcher, broadcasting/film/video: [employer]    Comment: [employer]   Tobacco Use  . Smoking status: Never Smoker  . Smokeless tobacco: Never Used  Substance and Sexual Activity  . Alcohol use: No  . Drug use: No  . Sexual activity: Never    Birth control/protection: Surgical    Comment: Husband deceased  Other Topics Concern  . Not on file  Social History Narrative  . Not on file   Social Determinants of Health   Financial Resource Strain: Low Risk   . Difficulty of Paying Living Expenses: Not hard at all  Food Insecurity: No Food Insecurity  . Worried About Charity fundraiser in the Last Year: Never true  . Ran Out of Food in the Last Year: Never true  Transportation Needs: No Transportation Needs  . Lack of Transportation (Medical): No  . Lack of Transportation (Non-Medical): No  Physical Activity: Insufficiently Active  . Days of Exercise per Week: 3 days  . Minutes of Exercise per Session: 20 min  Stress: No Stress Concern Present  . Feeling of Stress : Not at all  Social Connections: Moderately Integrated  . Frequency of  Communication with Friends and Family: More than three times a week  . Frequency of Social Gatherings with Friends and Family: More than three times a week  . Attends Religious Services: More than 4 times per year  . Active Member of Clubs or Organizations: Yes  . Attends Archivist Meetings: More than 4 times per year  . Marital Status: Widowed    Tobacco Counseling Counseling given: Not Answered   Clinical Intake:  Pre-visit preparation completed: Yes  Pain : No/denies pain     Nutritional Risks: Other (Comment) (GERD) Diabetes: No  How often do you need to have  someone help you when you read instructions, pamphlets, or other written materials from your doctor or pharmacy?: 1 - Never  Diabetic?No   Interpreter Needed?: No  Information entered by :: [place] of Daily Living In your present state of health, do you have any difficulty performing the following activities: 05/31/2020  Hearing? N  Vision? N  Difficulty concentrating or making decisions? N  Walking or climbing stairs? N  Dressing or bathing? N  Doing errands, shopping? N  Preparing Food and eating ? N  Using the Toilet? N  In the past six months, have you accidently leaked urine? N  Do you have problems with loss of bowel control? N  Managing your Medications? N  Managing your Finances? N  Housekeeping or managing your Housekeeping? N  Some recent data might be hidden    Patient Care Team: [facility], Modena Nunnery, MD as PCP - General (Family Medicine) Gala Romney, Cristopher Estimable, MD as Consulting Physician (Gastroenterology)  Indicate any recent Medical Services you may have received from other than [facility] providers in the past year (date may be approximate).     Assessment:   This is a routine wellness examination for [place].  Hearing/Vision screen  Hearing Screening   125Hz  250Hz  500Hz  1000Hz  2000Hz  3000Hz  4000Hz  6000Hz  8000Hz   Right ear:           Left ear:           Vision  Screening Comments: Patient has not had eye exam in 2 years. Wears glasses    Dietary issues and exercise activities discussed: Current Exercise Habits: Home exercise routine, Type of exercise: walking, Time (Minutes): 20, Frequency (Times/Week): 3, Weekly Exercise (Minutes/Week): 60, Intensity: Mild, Exercise limited by: None identified  Goals    . Exercise 3x per week (30 min per time)    . Patient Stated     I would like to travel more       Depression Screen PHQ 2/9 Scores 05/31/2020 04/06/2020 09/24/2019 03/11/2019 01/22/2018 03/27/2017 01/28/2016  PHQ - 2 Score 1 0 0 0 3 2 4   PHQ- 9 Score 1 - - - 8 9 10     Fall Risk Fall Risk  05/31/2020 04/06/2020 09/24/2019 03/11/2019 01/22/2018  Falls in the past year? 0 0 0 0 0  Number falls in past yr: 0 0 - - -  Injury with Fall? 0 0 - - -  Risk for fall due to : No Fall Risks - No Fall Risks No Fall Risks -  Follow up Falls evaluation completed;Falls prevention discussed - Falls evaluation completed Falls evaluation completed Falls evaluation completed    FALL RISK PREVENTION PERTAINING TO THE HOME:  Any stairs in or around the home? Yes  If so, are there any without handrails? No  Home free of loose throw rugs in walkways, pet beds, electrical cords, etc? Yes  Adequate lighting in your home to reduce risk of falls? Yes   ASSISTIVE DEVICES UTILIZED TO PREVENT FALLS:  Life alert? No  Use of a cane, walker or w/c? No  Grab bars in the bathroom? No  Shower chair or bench in shower? No  Elevated toilet seat or a handicapped toilet? Yes    Cognitive Function:   Normal cognitive status assessed by direct observation by this Nurse Health Advisor. No abnormalities found.        Immunizations Immunization History  Administered Date(s) Administered  . DT (Pediatric) 09/21/1995, 05/24/2006  . Influenza Whole 03/14/2010  . Influenza-Unspecified 11/20/2001,  12/14/2005  . Moderna Sars-Covid-2 Vaccination 04/26/2019, 06/07/2019, 01/28/2020  .  Pneumococcal Conjugate-13 03/27/2017  . Pneumococcal Polysaccharide-23 03/11/2019  . Td 09/11/2015  . Tdap 05/24/2006    TDAP status: Up to date  Flu Vaccine status: Declined, Education has been provided regarding the importance of this vaccine but patient still declined. Advised may receive this vaccine at local pharmacy or Health Dept. Aware to provide a copy of the vaccination record if obtained from local pharmacy or Health Dept. Verbalized acceptance and understanding.  Pneumococcal vaccine status: Up to date  Covid-19 vaccine status: Completed vaccines  Qualifies for Shingles Vaccine? Yes   Zostavax completed No   Shingrix Completed?: No.    Education has been provided regarding the importance of this vaccine. Patient has been advised to call insurance company to determine out of pocket expense if they have not yet received this vaccine. Advised may also receive vaccine at local pharmacy or Health Dept. Verbalized acceptance and understanding.  Screening Tests Health Maintenance  Topic Date Due  . Hepatitis C Screening  04/06/2021 (Originally Aug 24, 1950)  . INFLUENZA VACCINE  05/20/2028 (Originally 09/20/2020)  . MAMMOGRAM  07/28/2020  . COLONOSCOPY (Pts 45-68yrs Insurance coverage will need to be confirmed)  06/24/2023  . TETANUS/TDAP  09/10/2025  . DEXA SCAN  Completed  . COVID-19 Vaccine  Completed  . PNA vac Low Risk Adult  Completed  . HPV VACCINES  Aged Out    Health Maintenance  There are no preventive care reminders to display for this patient.  Colorectal cancer screening: Type of screening: Colonoscopy. Completed 06/23/2013. Repeat every 10 years  Mammogram status: Completed 07/29/2019. Repeat every year  Bone Density status: Completed 07/29/2019. Results reflect: Bone density results: OSTEOPENIA. Repeat every 5 years.  Lung Cancer Screening: (Low Dose CT Chest recommended if Age 56-80 years, 30 pack-year currently smoking OR have quit w/in 15years.) does not  qualify.   Lung Cancer Screening Referral: N/A   Additional Screening:  Hepatitis C Screening: does qualify;   Vision Screening: Recommended annual ophthalmology exams for early detection of glaucoma and other disorders of the eye. Is the patient up to date with their annual eye exam?  No  Who is the provider or what is the name of the office in which the patient attends annual eye exams? [facility]  If pt is not established with a provider, would they like to be referred to a provider to establish care? No .   Dental Screening: Recommended annual dental exams for proper oral hygiene  Community Resource Referral / Chronic Care Management: CRR required this visit?  No   CCM required this visit?  No      Plan:     I have personally reviewed and noted the following in the patient's chart:   . Medical and social history . Use of alcohol, tobacco or illicit drugs  . Current medications and supplements . Functional ability and status . Nutritional status . Physical activity . Advanced directives . List of other physicians . Hospitalizations, surgeries, and ER visits in previous 12 months . Vitals . Screenings to include cognitive, depression, and falls . Referrals and appointments  In addition, I have reviewed and discussed with patient certain preventive protocols, quality metrics, and best practice recommendations. A written personalized care plan for preventive services as well as general preventive health recommendations were provided to patient.     Ofilia Neas, LPN   5/32/9924   Nurse Notes: None

## 2020-05-31 ENCOUNTER — Ambulatory Visit (INDEPENDENT_AMBULATORY_CARE_PROVIDER_SITE_OTHER): Payer: Medicare Other

## 2020-05-31 ENCOUNTER — Other Ambulatory Visit: Payer: Self-pay

## 2020-05-31 DIAGNOSIS — Z0000 Encounter for general adult medical examination without abnormal findings: Secondary | ICD-10-CM | POA: Diagnosis not present

## 2020-05-31 NOTE — Patient Instructions (Signed)
Ms. Lindsay Mccormick , Thank you for taking time to come for your Medicare Wellness Visit. I appreciate your ongoing commitment to your health goals. Please review the following plan we discussed and let me know if I can assist you in the future.   Screening recommendations/referrals: Colonoscopy: Up to date, next due 06/24/2023 Mammogram: Up to date, next due 07/28/2020 Bone Density: Up to date next due 07/28/2024 Recommended yearly ophthalmology/optometry visit for glaucoma screening and checkup Recommended yearly dental visit for hygiene and checkup  Vaccinations: Influenza vaccine: Patient declined  Pneumococcal vaccine: Completed series  Tdap vaccine: Up to date, next due 09/10/2025 Shingles vaccine: Currently due, if you would like to receive we recommend that you do so at your local pharmacy as it is less expensive.     Advanced directives: Please bring in copies of your advanced medical directives so that we may scan into your chart.   Conditions/risks identified: None   Next appointment: 06/06/2021 @ 9:00 am with [place] 68 Years and Older, Female Preventive care refers to lifestyle choices and visits with your health care provider that can promote health and wellness. What does preventive care include?  A yearly physical exam. This is also called an annual well check.  Dental exams once or twice a year.  Routine eye exams. Ask your health care provider how often you should have your eyes checked.  Personal lifestyle choices, including:  Daily care of your teeth and gums.  Regular physical activity.  Eating a healthy diet.  Avoiding tobacco and drug use.  Limiting alcohol use.  Practicing safe sex.  Taking low-dose aspirin every day.  Taking vitamin and mineral supplements as recommended by your health care provider. What happens during an annual well check? The services and screenings done by your health care provider during your  annual well check will depend on your age, overall health, lifestyle risk factors, and family history of disease. Counseling  Your health care provider may ask you questions about your:  Alcohol use.  Tobacco use.  Drug use.  Emotional well-being.  Home and relationship well-being.  Sexual activity.  Eating habits.  History of falls.  Memory and ability to understand (cognition).  Work and work Statistician.  Reproductive health. Screening  You may have the following tests or measurements:  Height, weight, and BMI.  Blood pressure.  Lipid and cholesterol levels. These may be checked every 5 years, or more frequently if you are over 6 years old.  Skin check.  Lung cancer screening. You may have this screening every year starting at age 68 if you have a 30-pack-year history of smoking and currently smoke or have quit within the past 15 years.  Fecal occult blood test (FOBT) of the stool. You may have this test every year starting at age 68.  Flexible sigmoidoscopy or colonoscopy. You may have a sigmoidoscopy every 5 years or a colonoscopy every 10 years starting at age 68.  Hepatitis C blood test.  Hepatitis B blood test.  Sexually transmitted disease (STD) testing.  Diabetes screening. This is done by checking your blood sugar (glucose) after you have not eaten for a while (fasting). You may have this done every 1-3 years.  Bone density scan. This is done to screen for osteoporosis. You may have this done starting at age 26.  Mammogram. This may be done every 1-2 years. Talk to your health care provider about how often you should have regular mammograms. Talk with  your health care provider about your test results, treatment options, and if necessary, the need for more tests. Vaccines  Your health care provider may recommend certain vaccines, such as:  Influenza vaccine. This is recommended every year.  Tetanus, diphtheria, and acellular pertussis (Tdap, Td)  vaccine. You may need a Td booster every 10 years.  Zoster vaccine. You may need this after age 68.  Pneumococcal 13-valent conjugate (PCV13) vaccine. One dose is recommended after age 27.  Pneumococcal polysaccharide (PPSV23) vaccine. One dose is recommended after age 16. Talk to your health care provider about which screenings and vaccines you need and how often you need them. This information is not intended to replace advice given to you by your health care provider. Make sure you discuss any questions you have with your health care provider. Document Released: 03/05/2015 Document Revised: 10/27/2015 Document Reviewed: 12/08/2014 Elsevier Interactive Patient Education  2017 [place] Prevention in the Home Falls can cause injuries. They can happen to people of all ages. There are many things you can do to make your home safe and to help prevent falls. What can I do on the outside of my home?  Regularly fix the edges of walkways and driveways and fix any cracks.  Remove anything that might make you trip as you walk through a door, such as a raised step or threshold.  Trim any bushes or trees on the path to your home.  Use bright outdoor lighting.  Clear any walking paths of anything that might make someone trip, such as rocks or tools.  Regularly check to see if handrails are loose or broken. Make sure that both sides of any steps have handrails.  Any raised decks and porches should have guardrails on the edges.  Have any leaves, snow, or ice cleared regularly.  Use sand or salt on walking paths during winter.  Clean up any spills in your garage right away. This includes oil or grease spills. What can I do in the bathroom?  Use night lights.  Install grab bars by the toilet and in the tub and shower. Do not use towel bars as grab bars.  Use non-skid mats or decals in the tub or shower.  If you need to sit down in the shower, use a plastic, non-slip  stool.  Keep the floor dry. Clean up any water that spills on the floor as soon as it happens.  Remove soap buildup in the tub or shower regularly.  Attach bath mats securely with double-sided non-slip rug tape.  Do not have throw rugs and other things on the floor that can make you trip. What can I do in the bedroom?  Use night lights.  Make sure that you have a light by your bed that is easy to reach.  Do not use any sheets or blankets that are too big for your bed. They should not hang down onto the floor.  Have a firm chair that has side arms. You can use this for support while you get dressed.  Do not have throw rugs and other things on the floor that can make you trip. What can I do in the kitchen?  Clean up any spills right away.  Avoid walking on wet floors.  Keep items that you use a lot in easy-to-reach places.  If you need to reach something above you, use a strong step stool that has a grab bar.  Keep electrical cords out of the way.  Do not  use floor polish or wax that makes floors slippery. If you must use wax, use non-skid floor wax.  Do not have throw rugs and other things on the floor that can make you trip. What can I do with my stairs?  Do not leave any items on the stairs.  Make sure that there are handrails on both sides of the stairs and use them. Fix handrails that are broken or loose. Make sure that handrails are as long as the stairways.  Check any carpeting to make sure that it is firmly attached to the stairs. Fix any carpet that is loose or worn.  Avoid having throw rugs at the top or bottom of the stairs. If you do have throw rugs, attach them to the floor with carpet tape.  Make sure that you have a light switch at the top of the stairs and the bottom of the stairs. If you do not have them, ask someone to add them for you. What else can I do to help prevent falls?  Wear shoes that:  Do not have high heels.  Have rubber bottoms.  Are  comfortable and fit you well.  Are closed at the toe. Do not wear sandals.  If you use a stepladder:  Make sure that it is fully opened. Do not climb a closed stepladder.  Make sure that both sides of the stepladder are locked into place.  Ask someone to hold it for you, if possible.  Clearly mark and make sure that you can see:  Any grab bars or handrails.  First and last steps.  Where the edge of each step is.  Use tools that help you move around (mobility aids) if they are needed. These include:  Canes.  Walkers.  Scooters.  Crutches.  Turn on the lights when you go into a dark area. Replace any light bulbs as soon as they burn out.  Set up your furniture so you have a clear path. Avoid moving your furniture around.  If any of your floors are uneven, fix them.  If there are any pets around you, be aware of where they are.  Review your medicines with your doctor. Some medicines can make you feel dizzy. This can increase your chance of falling. Ask your doctor what other things that you can do to help prevent falls. This information is not intended to replace advice given to you by your health care provider. Make sure you discuss any questions you have with your health care provider. Document Released: 12/03/2008 Document Revised: 07/15/2015 Document Reviewed: 03/13/2014 Elsevier Interactive Patient Education  2017 [employer].

## 2020-06-02 DIAGNOSIS — D2339 Other benign neoplasm of skin of other parts of face: Secondary | ICD-10-CM | POA: Diagnosis not present

## 2020-06-02 DIAGNOSIS — Z1283 Encounter for screening for malignant neoplasm of skin: Secondary | ICD-10-CM | POA: Diagnosis not present

## 2020-06-02 DIAGNOSIS — D2361 Other benign neoplasm of skin of right upper limb, including shoulder: Secondary | ICD-10-CM | POA: Diagnosis not present

## 2020-06-02 DIAGNOSIS — D235 Other benign neoplasm of skin of trunk: Secondary | ICD-10-CM | POA: Diagnosis not present

## 2020-06-02 DIAGNOSIS — Z85828 Personal history of other malignant neoplasm of skin: Secondary | ICD-10-CM | POA: Diagnosis not present

## 2020-06-02 DIAGNOSIS — D2321 Other benign neoplasm of skin of right ear and external auricular canal: Secondary | ICD-10-CM | POA: Diagnosis not present

## 2020-06-02 DIAGNOSIS — Z719 Counseling, unspecified: Secondary | ICD-10-CM | POA: Diagnosis not present

## 2020-06-02 DIAGNOSIS — D2322 Other benign neoplasm of skin of left ear and external auricular canal: Secondary | ICD-10-CM | POA: Diagnosis not present

## 2020-06-02 DIAGNOSIS — D230 Other benign neoplasm of skin of lip: Secondary | ICD-10-CM | POA: Diagnosis not present

## 2020-06-02 DIAGNOSIS — D23121 Other benign neoplasm of skin of left upper eyelid, including canthus: Secondary | ICD-10-CM | POA: Diagnosis not present

## 2020-06-02 DIAGNOSIS — D23111 Other benign neoplasm of skin of right upper eyelid, including canthus: Secondary | ICD-10-CM | POA: Diagnosis not present

## 2020-06-02 DIAGNOSIS — D234 Other benign neoplasm of skin of scalp and neck: Secondary | ICD-10-CM | POA: Diagnosis not present

## 2021-01-06 ENCOUNTER — Telehealth: Payer: Self-pay

## 2021-01-06 MED ORDER — HYDROCHLOROTHIAZIDE 25 MG PO TABS: ORAL_TABLET | ORAL | 0 refills | Status: DC

## 2021-01-06 NOTE — Telephone Encounter (Signed)
Patient called to request courtesy refill of hydrochlorothiazide (HYDRODIURIL) 25 MG tablet [[id number]]  Patient stated courtesy refill for this Rx not received yet; having a hard time finding a provider in her area.   Pharmacy confirmed as  [id number] - [place], [address], [place] [address]  Phone:  [phone]  Fax:  [phone]  DEA #:  [id number]  Patient has been out of medication since Sunday, 01/02/21. Requesting call back if refill request will not be honored.  Please advise at [phone].

## 2021-01-06 NOTE — Telephone Encounter (Signed)
Prescription sent to pharmacy.  No further refills to be given. Patient must establish with new PCP.

## 2021-01-30 ENCOUNTER — Other Ambulatory Visit: Payer: Self-pay | Admitting: Family Medicine

## 2021-02-04 ENCOUNTER — Other Ambulatory Visit: Payer: Self-pay

## 2021-02-04 MED ORDER — ALENDRONATE SODIUM 70 MG PO TABS: ORAL_TABLET | ORAL | 0 refills | Status: AC

## 2021-06-06 ENCOUNTER — Ambulatory Visit: Payer: Medicare Other
